# Patient Record
Sex: Female | Born: 1975 | Race: Black or African American | Hispanic: No | Marital: Single | Smoking: Current some day smoker
Health system: Southern US, Community
[De-identification: ages and names within clinical notes are randomized; demographics above are authoritative.]

## PROBLEM LIST (undated history)

## (undated) DIAGNOSIS — D649 Anemia, unspecified: Secondary | ICD-10-CM

## (undated) DIAGNOSIS — Z5189 Encounter for other specified aftercare: Secondary | ICD-10-CM

## (undated) HISTORY — PX: OTHER SURGICAL HISTORY: SHX169

## (undated) HISTORY — DX: Anemia, unspecified: D64.9

## (undated) HISTORY — DX: Encounter for other specified aftercare: Z51.89

---

## 2003-08-30 ENCOUNTER — Other Ambulatory Visit: Payer: Self-pay

## 2009-09-12 ENCOUNTER — Ambulatory Visit: Payer: Self-pay | Admitting: Family

## 2009-09-12 IMAGING — CR DG HIP COMPLETE 2+V*L*
1 series · 2 of 2 positions shown · non-contrast
Comparison: none

REASON FOR EXAM: leg pain hip pain back pain back pain
COMMENTS:

PROCEDURE:     DXR - DXR HIP LEFT COMPLETE  - [DATE]  [DATE]
RESULT:     Images of the left hip demonstrate no fracture, dislocation or
foreign body. There some mild degenerative hypertrophic spurring along the
superior margin of the acetabulum.

[Series 1: view not recorded · 0.17mm/px · 2 of 2 slices shown]
[im 1/2]
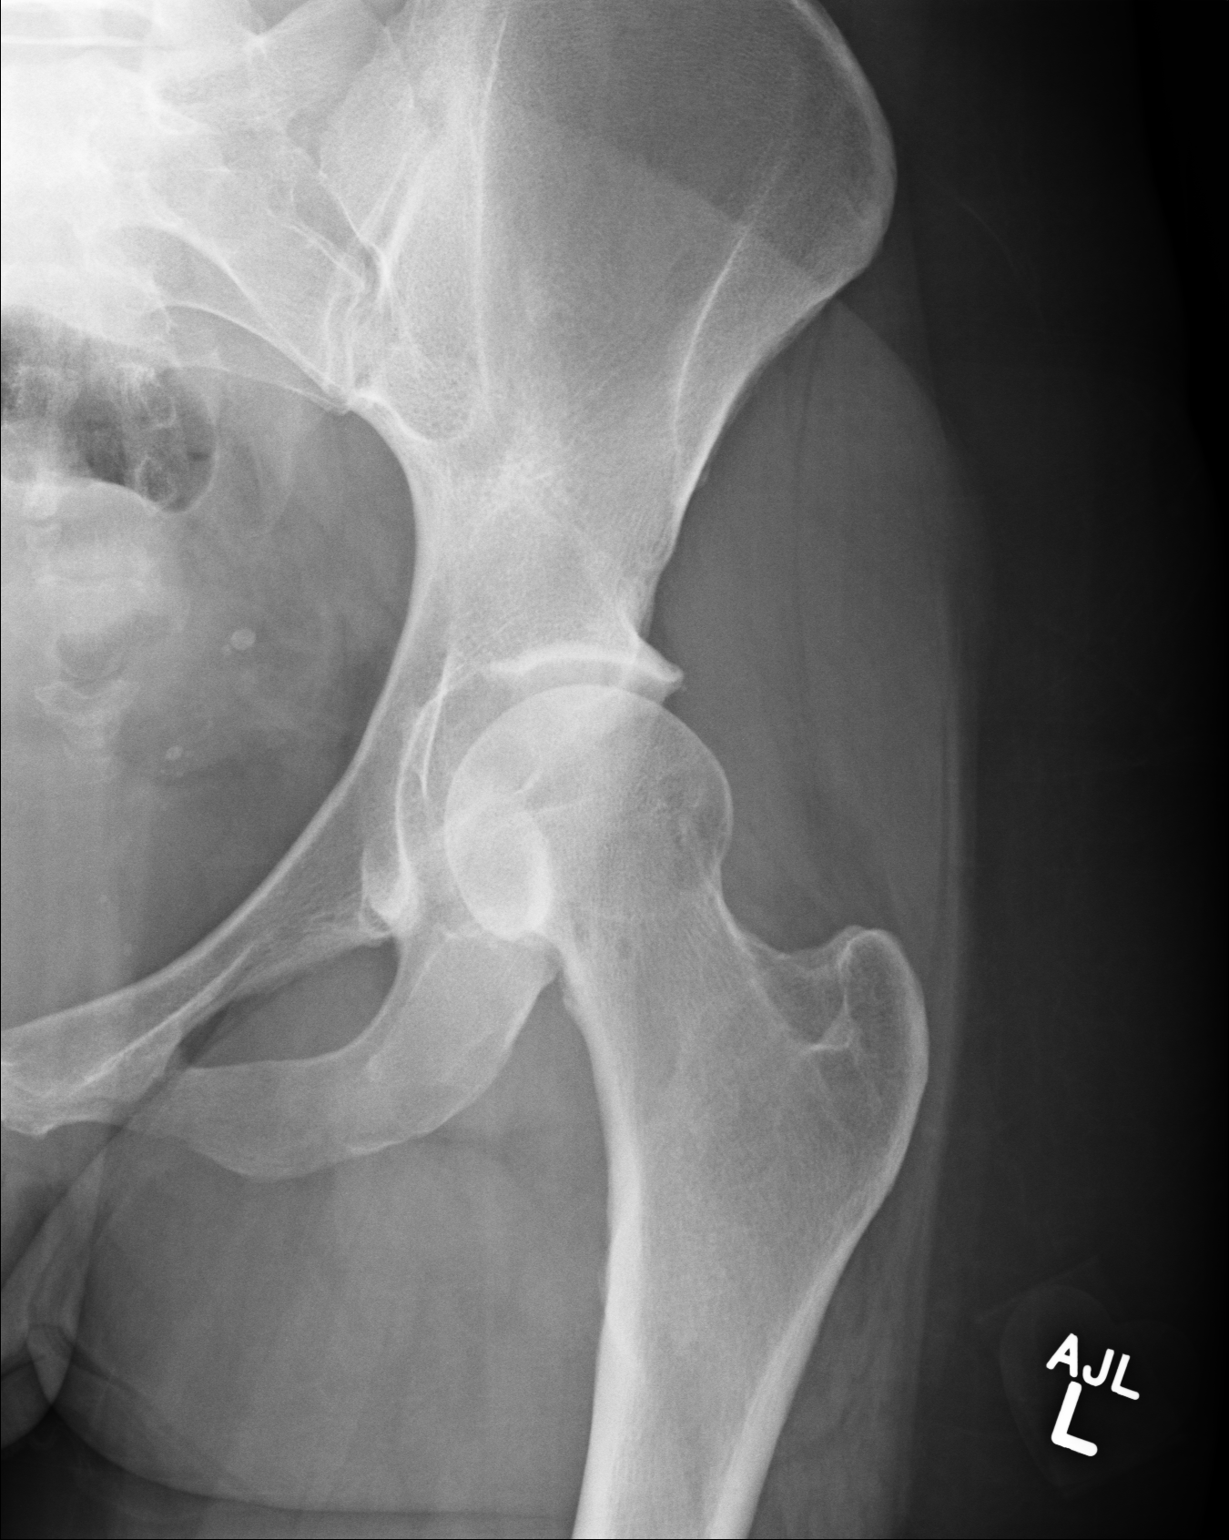
[im 2/2]
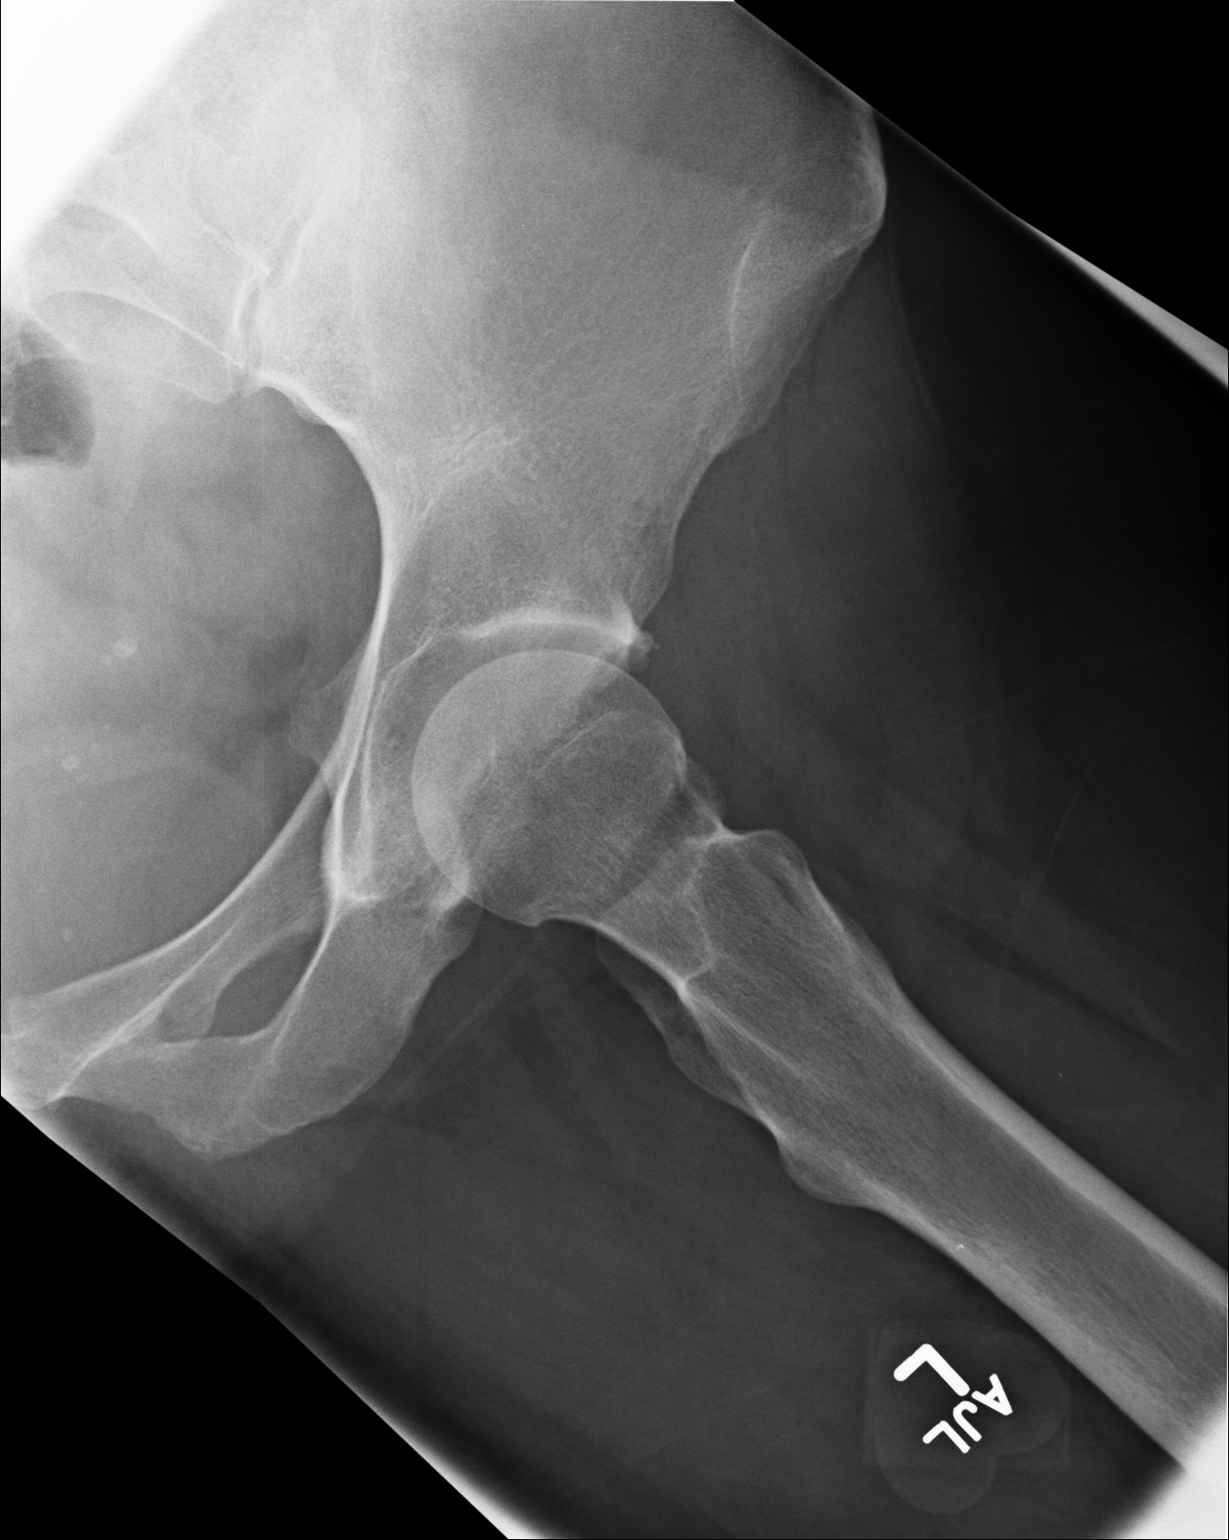

[2 of 2 positions shown; findings below may reference images not displayed]

IMPRESSION: No acute bony abnormality.

## 2009-09-12 IMAGING — CR DG LUMBAR SPINE AP/LAT/OBLIQUES W/ FLEX AND EXT
1 series · 5 of 5 positions shown · non-contrast
Comparison: none

REASON FOR EXAM: leg pain hip pain back pain back pain
COMMENTS:

PROCEDURE:     DXR - DXR LUMBAR SPINE WITH OBLIQUES  - [DATE]  [DATE]
RESULT:     Images of the lumbar spine show the facets are normally aligned.
The vertebral body heights and intervertebral disc spaces are maintained.
There is no compression deformity or fracture.

[Series 1: view not recorded · 0.17mm/px · 5 of 5 slices shown]
[im 1/5]
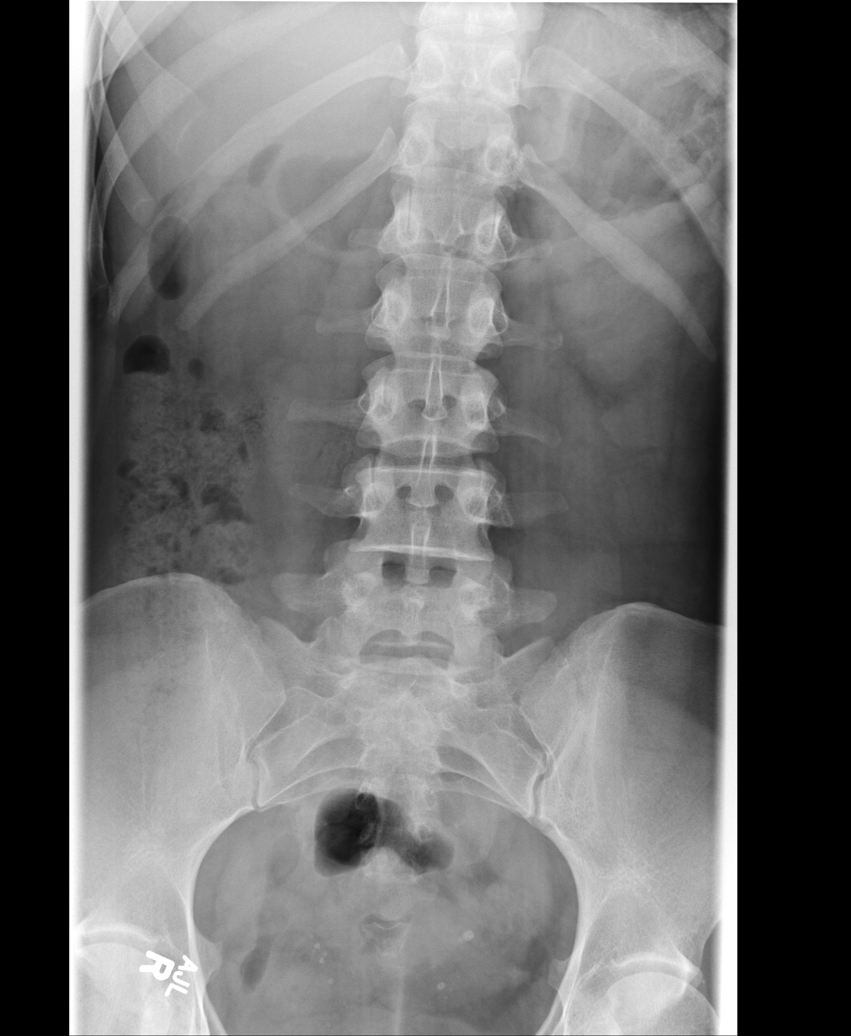
[im 2/5]
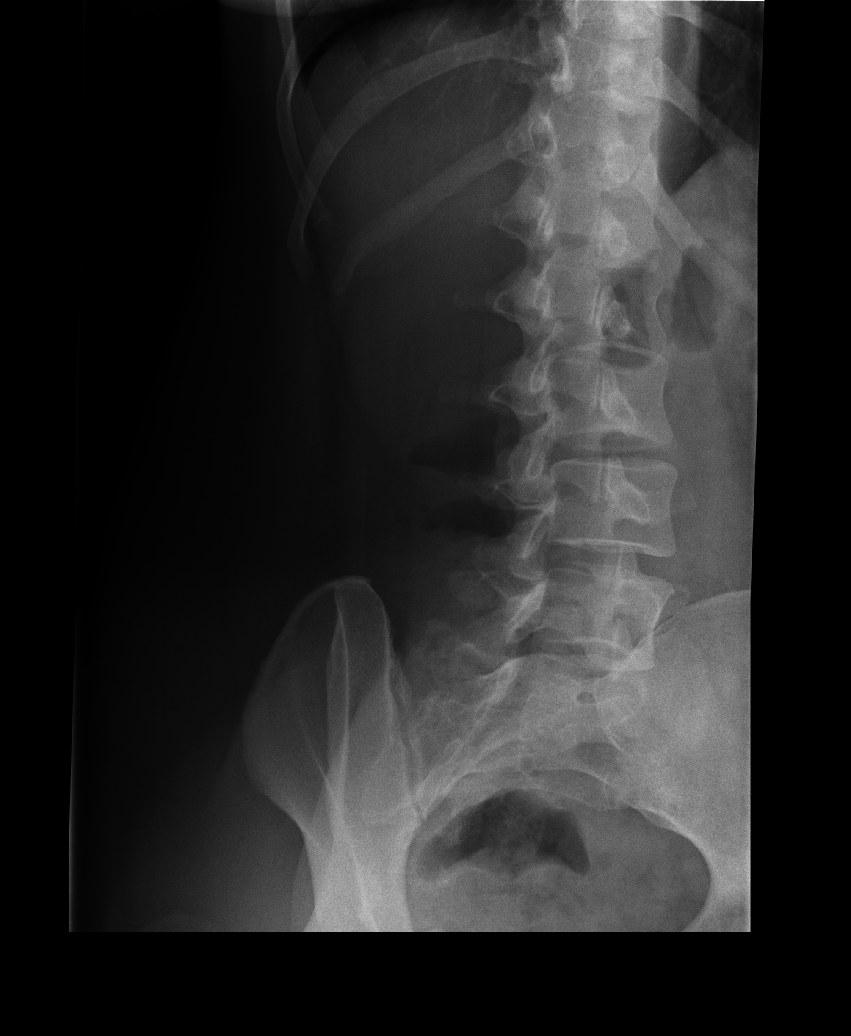
[im 3/5]
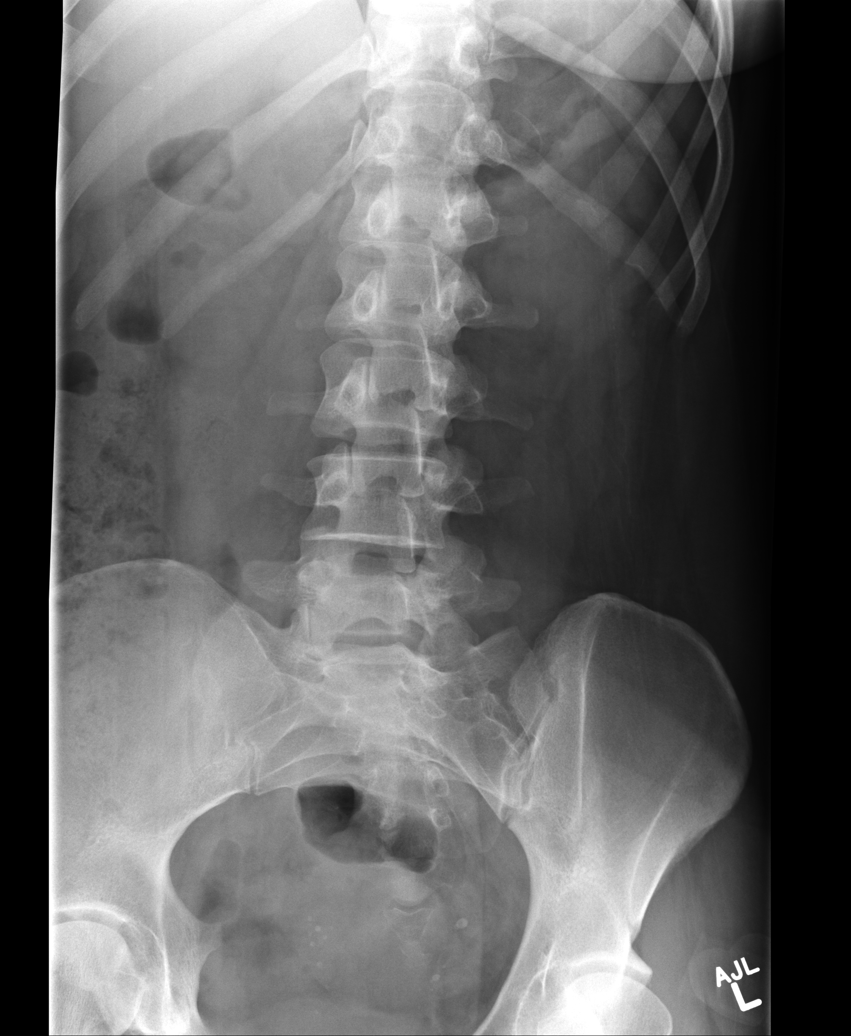
[im 4/5]
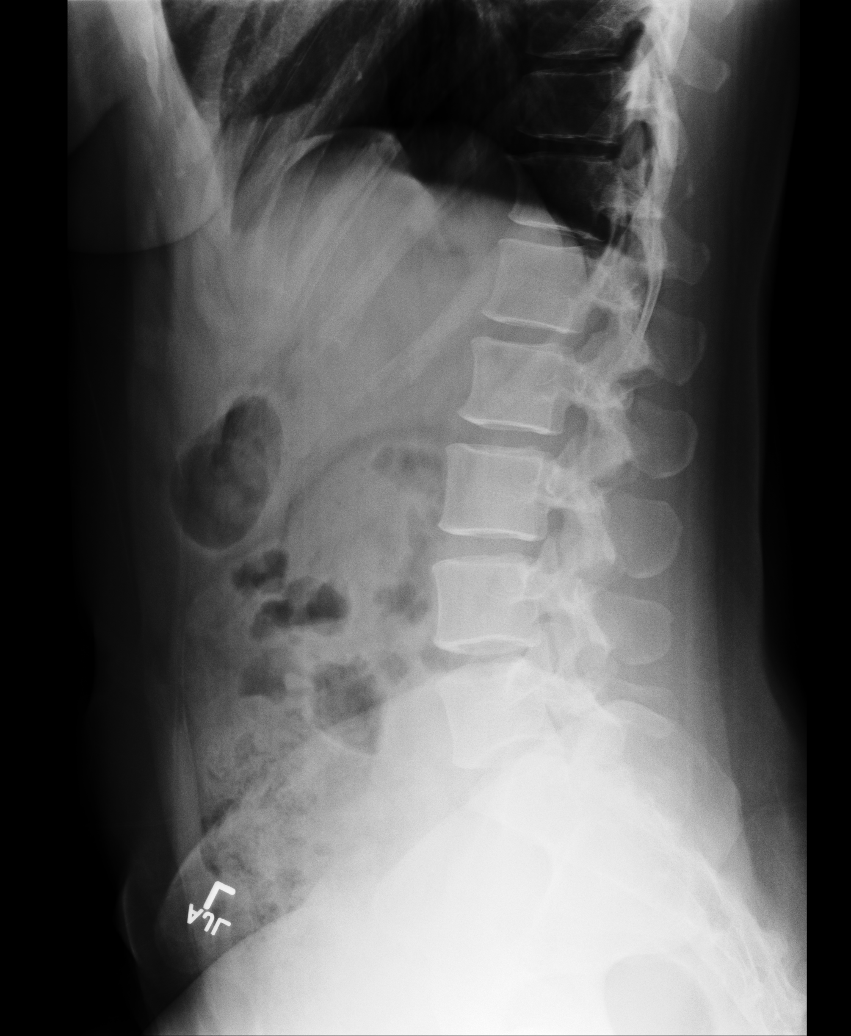
[im 5/5]
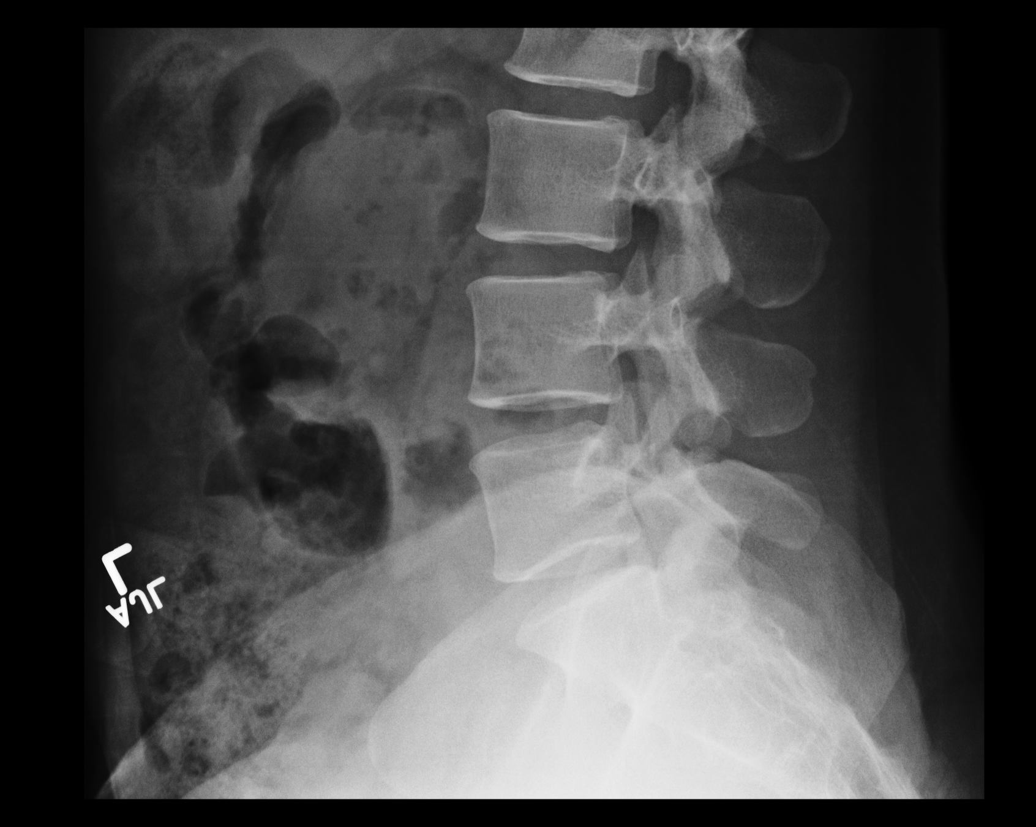

[5 of 5 positions shown; findings below may reference images not displayed]

IMPRESSION: No acute bony abnormality.

## 2011-11-28 ENCOUNTER — Emergency Department: Payer: Self-pay | Admitting: Emergency Medicine

## 2011-11-28 LAB — URINALYSIS, COMPLETE
Bacteria: NONE SEEN
Bilirubin,UR: NEGATIVE
Glucose,UR: NEGATIVE mg/dL (ref 0–75)
Ketone: NEGATIVE
Leukocyte Esterase: NEGATIVE
Nitrite: NEGATIVE
Ph: 7 (ref 4.5–8.0)
Protein: NEGATIVE
Specific Gravity: 1.01 (ref 1.003–1.030)
Squamous Epithelial: 3
WBC UR: NONE SEEN /HPF (ref 0–5)

## 2011-11-28 LAB — COMPREHENSIVE METABOLIC PANEL
Alkaline Phosphatase: 76 U/L (ref 50–136)
Anion Gap: 4 — ABNORMAL LOW (ref 7–16)
BUN: 12 mg/dL (ref 7–18)
Calcium, Total: 9.4 mg/dL (ref 8.5–10.1)
Co2: 29 mmol/L (ref 21–32)
Creatinine: 0.71 mg/dL (ref 0.60–1.30)
EGFR (African American): >60
EGFR (Non-African Amer.): >60
Osmolality: 274 (ref 275–301)
Potassium: 3.9 mmol/L (ref 3.5–5.1)
SGOT(AST): 23 U/L (ref 15–37)
Sodium: 138 mmol/L (ref 136–145)

## 2011-11-28 LAB — CK TOTAL AND CKMB (NOT AT ARMC): CK-MB: <0.5 ng/mL — ABNORMAL LOW (ref 0.5–3.6)

## 2011-11-28 LAB — TROPONIN I: Troponin-I: <0.02 ng/mL

## 2012-01-14 ENCOUNTER — Emergency Department: Payer: Self-pay | Admitting: Emergency Medicine

## 2013-11-12 ENCOUNTER — Emergency Department: Payer: Self-pay | Admitting: Emergency Medicine

## 2013-11-12 LAB — URINALYSIS, COMPLETE
Bacteria: NONE SEEN
Bilirubin,UR: NEGATIVE
Glucose,UR: NEGATIVE mg/dL (ref 0–75)
KETONE: NEGATIVE
LEUKOCYTE ESTERASE: NEGATIVE
NITRITE: NEGATIVE
PROTEIN: NEGATIVE
Ph: 6 (ref 4.5–8.0)
RBC,UR: 1 /HPF (ref 0–5)
SPECIFIC GRAVITY: 1.003 (ref 1.003–1.030)
WBC UR: 1 /HPF (ref 0–5)

## 2014-01-10 IMAGING — US US ABDOMEN LIMITED
1 series · 14 of 25 positions shown · non-contrast
Comparison: None.

CLINICAL DATA: Abdominal pain for 4 days.  Tenderness.

EXAM:
US ABDOMEN LIMITED - RIGHT UPPER QUADRANT

[Series 1: us abdomen limited · 0.22mm/px · 14 of 45 slices shown]
[im 1/45]
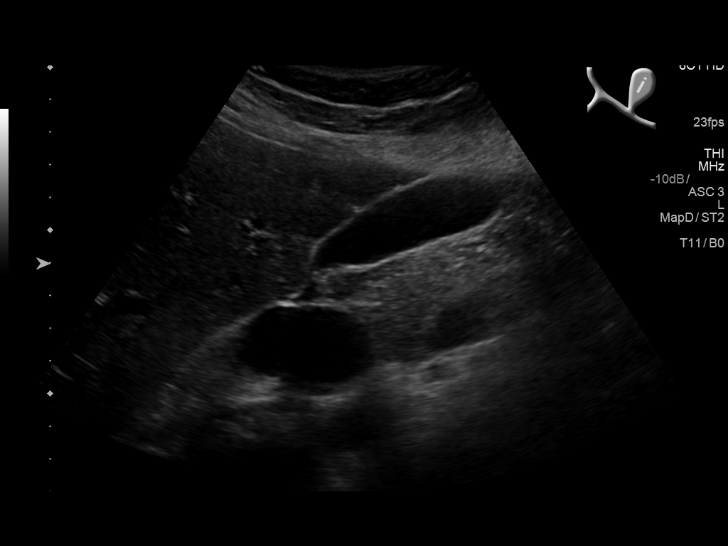
[im 4/45]
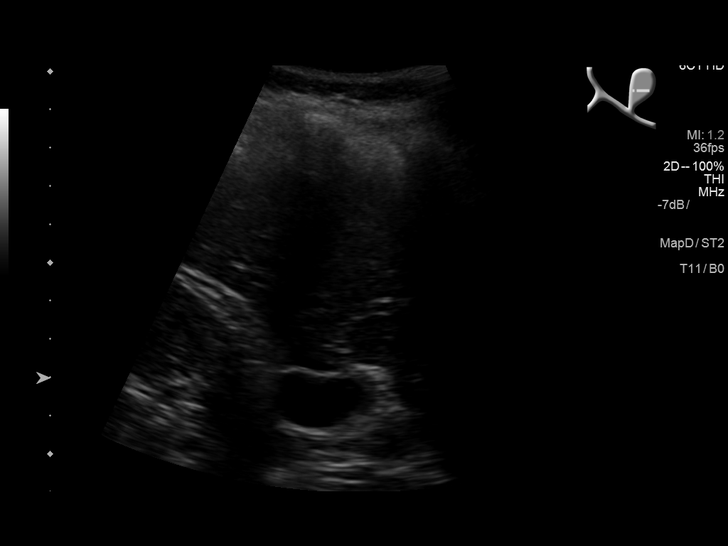
[im 8/45]
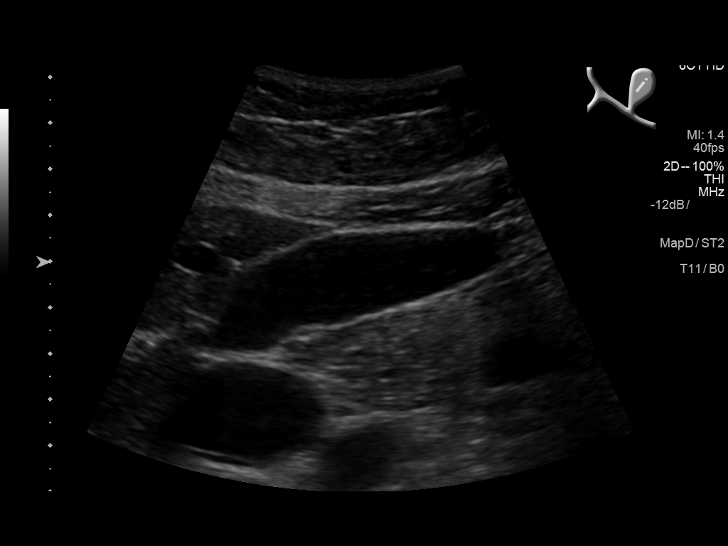
[im 12/45]
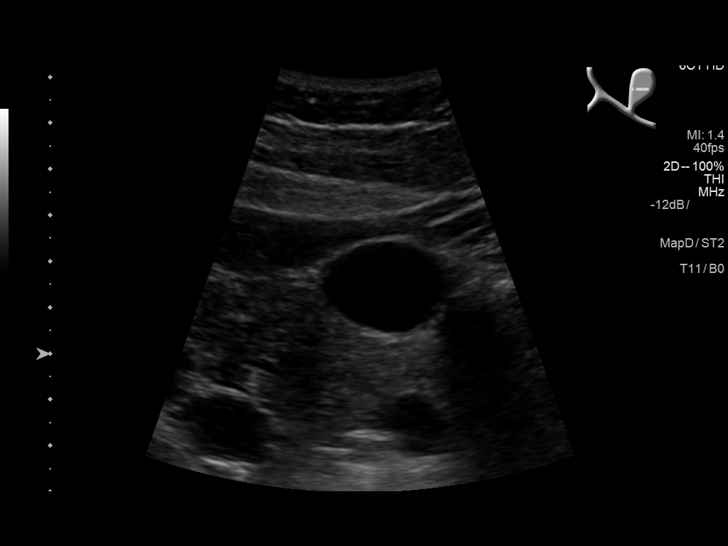
[im 15/45]
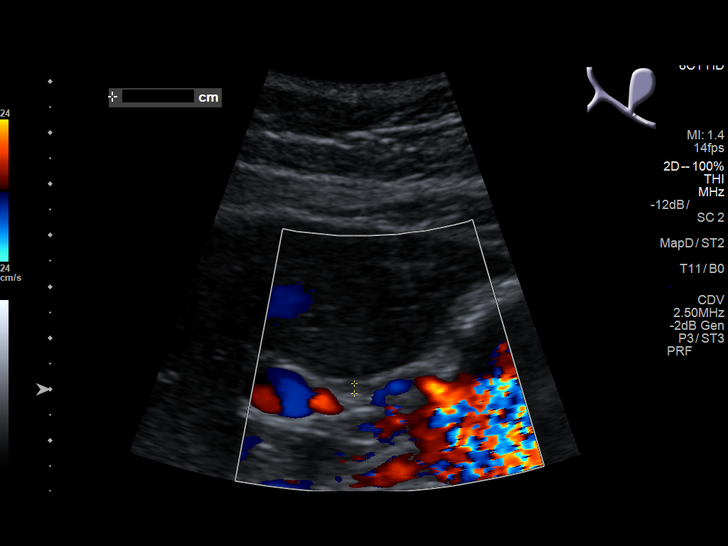
[im 17/45]
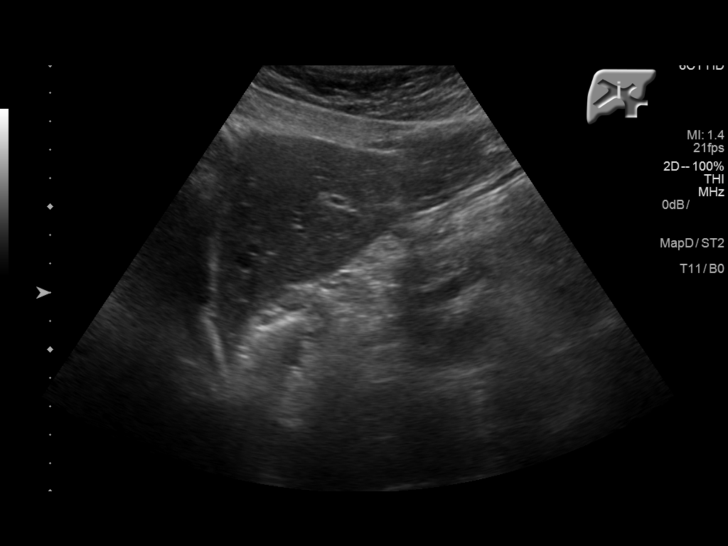
[im 21/45]
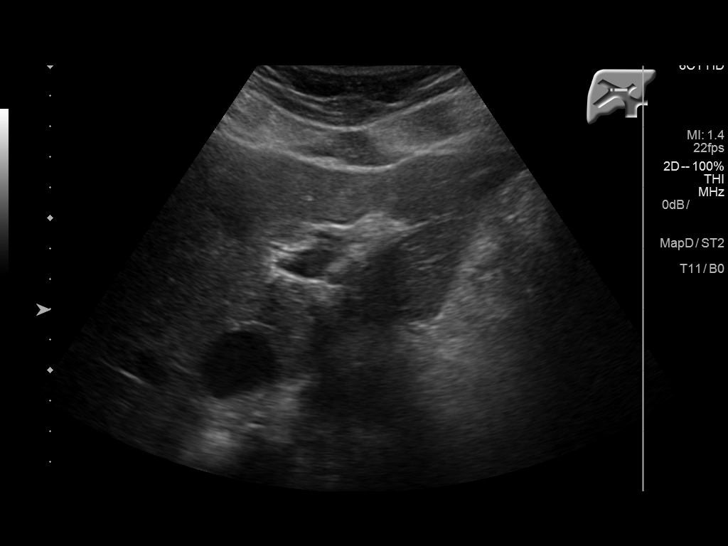
[im 24/45]
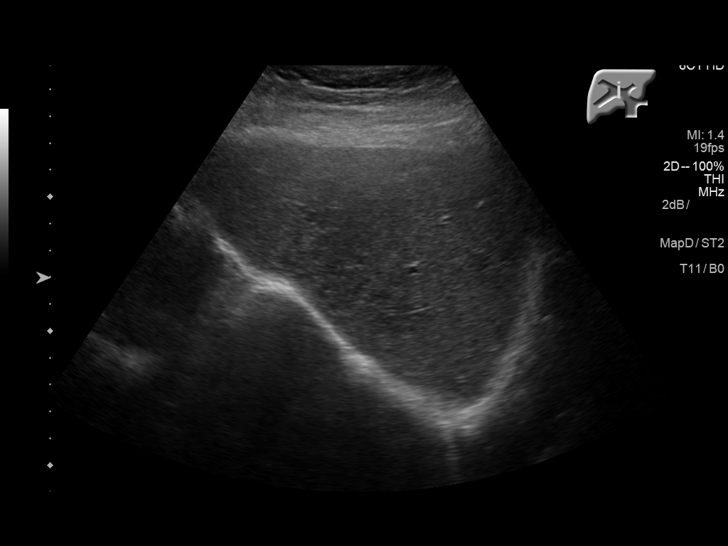
[im 28/45]
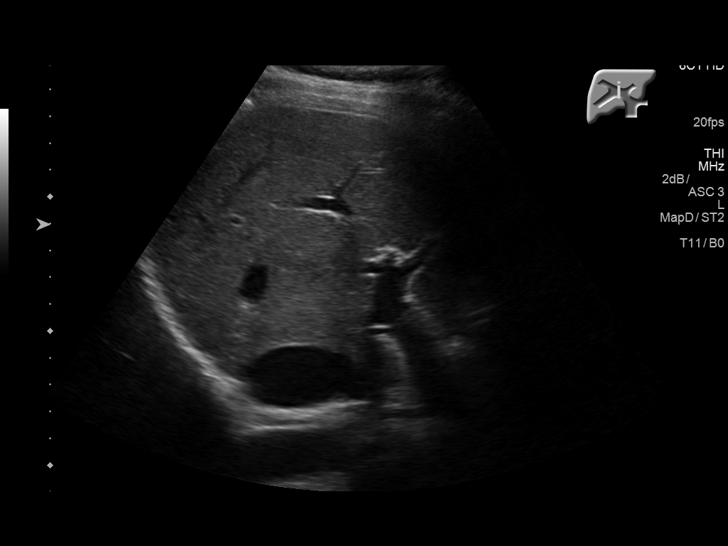
[im 30/45]
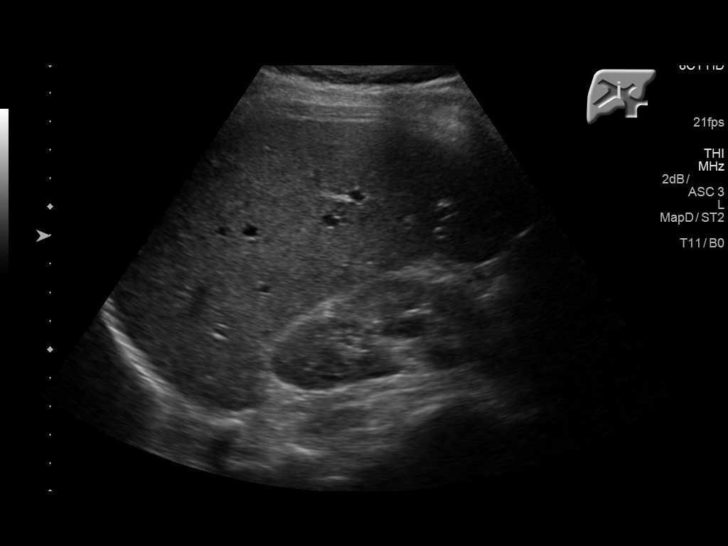
[im 34/45]
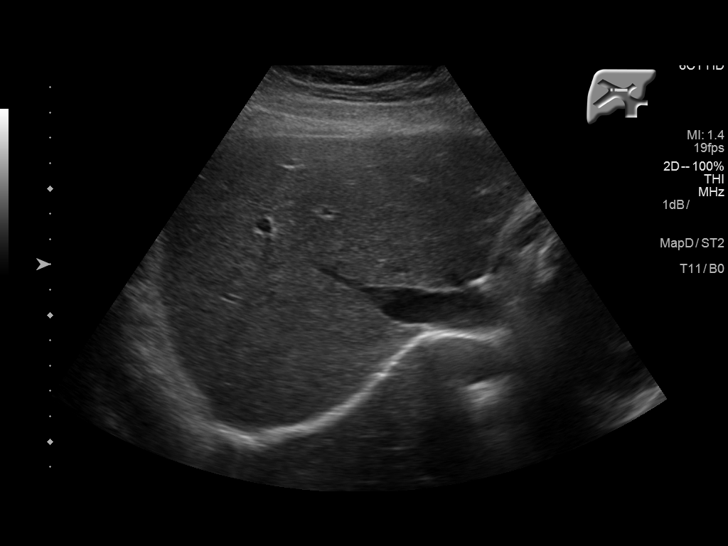
[im 37/45]
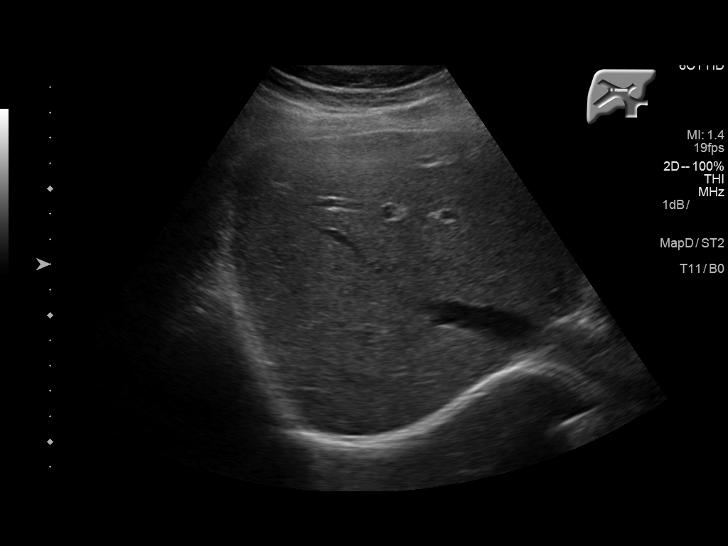
[im 41/45]
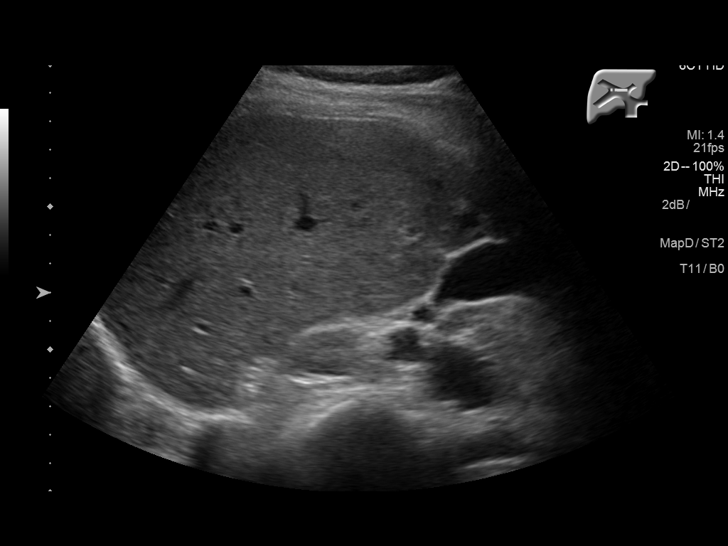
[im 45/45]
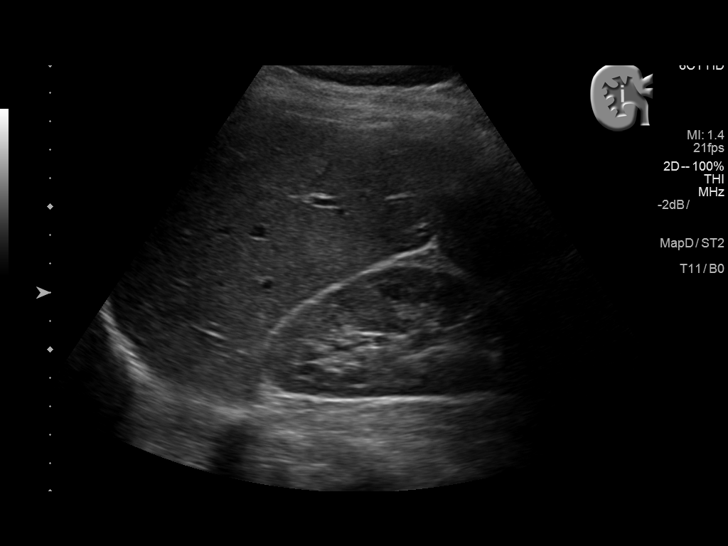

[14 of 25 positions shown; findings below may reference images not displayed]

FINDINGS: Gallbladder:

No gallstones or wall thickening visualized. No sonographic Murphy
sign noted by sonographer.

Common bile duct:

Diameter: 1.9 mm

Liver:

No focal lesion identified. Within normal limits in parenchymal
echogenicity.
IMPRESSION: Normal exam.

## 2014-11-04 ENCOUNTER — Emergency Department
Admission: EM | Admit: 2014-11-04 | Discharge: 2014-11-04 | Disposition: A | Discharge status: Home / Self Care | Payer: Self-pay | Attending: Emergency Medicine | Admitting: Emergency Medicine

## 2014-11-04 ENCOUNTER — Emergency Department: Payer: Self-pay

## 2014-11-04 ENCOUNTER — Encounter: Payer: Self-pay | Admitting: Emergency Medicine

## 2014-11-04 DIAGNOSIS — R0789 Other chest pain: Secondary | ICD-10-CM

## 2014-11-04 DIAGNOSIS — G43009 Migraine without aura, not intractable, without status migrainosus: Secondary | ICD-10-CM

## 2014-11-04 DIAGNOSIS — G43909 Migraine, unspecified, not intractable, without status migrainosus: Secondary | ICD-10-CM | POA: Insufficient documentation

## 2014-11-04 DIAGNOSIS — Z3202 Encounter for pregnancy test, result negative: Secondary | ICD-10-CM | POA: Insufficient documentation

## 2014-11-04 DIAGNOSIS — Z720 Tobacco use: Secondary | ICD-10-CM | POA: Insufficient documentation

## 2014-11-04 LAB — URINALYSIS COMPLETE WITH MICROSCOPIC (ARMC ONLY)
Bacteria, UA: NONE SEEN
Bilirubin Urine: NEGATIVE
GLUCOSE, UA: NEGATIVE mg/dL
Ketones, ur: NEGATIVE mg/dL
Leukocytes, UA: NEGATIVE
Nitrite: NEGATIVE
PROTEIN: NEGATIVE mg/dL
Specific Gravity, Urine: 1.004 — ABNORMAL LOW (ref 1.005–1.030)
pH: 7 (ref 5.0–8.0)

## 2014-11-04 LAB — COMPREHENSIVE METABOLIC PANEL
ALK PHOS: 52 U/L (ref 38–126)
ALT: 13 U/L — AB (ref 14–54)
AST: 21 U/L (ref 15–41)
Albumin: 4.3 g/dL (ref 3.5–5.0)
Anion gap: 6 (ref 5–15)
BILIRUBIN TOTAL: 0.5 mg/dL (ref 0.3–1.2)
BUN: 14 mg/dL (ref 6–20)
CALCIUM: 9.7 mg/dL (ref 8.9–10.3)
CO2: 28 mmol/L (ref 22–32)
CREATININE: 0.64 mg/dL (ref 0.44–1.00)
Chloride: 105 mmol/L (ref 101–111)
GFR calc non Af Amer: >60 mL/min (ref >60)
GLUCOSE: 87 mg/dL (ref 65–99)
Potassium: 4.1 mmol/L (ref 3.5–5.1)
SODIUM: 139 mmol/L (ref 135–145)
TOTAL PROTEIN: 7.1 g/dL (ref 6.5–8.1)

## 2014-11-04 LAB — CBC WITH DIFFERENTIAL/PLATELET
BASOS ABS: 0 10*3/uL (ref 0–0.1)
Basophils Relative: 1 %
Eosinophils Absolute: 0 10*3/uL (ref 0–0.7)
Eosinophils Relative: 1 %
HEMATOCRIT: 37 % (ref 35.0–47.0)
HEMOGLOBIN: 12.1 g/dL (ref 12.0–16.0)
LYMPHS PCT: 28 %
Lymphs Abs: 1.5 10*3/uL (ref 1.0–3.6)
MCH: 28.9 pg (ref 26.0–34.0)
MCHC: 32.8 g/dL (ref 32.0–36.0)
MCV: 88.3 fL (ref 80.0–100.0)
Monocytes Absolute: 0.5 10*3/uL (ref 0.2–0.9)
Monocytes Relative: 10 %
NEUTROS ABS: 3.2 10*3/uL (ref 1.4–6.5)
Neutrophils Relative %: 60 %
PLATELETS: 168 10*3/uL (ref 150–440)
RBC: 4.19 MIL/uL (ref 3.80–5.20)
RDW: 13.3 % (ref 11.5–14.5)
WBC: 5.2 10*3/uL (ref 3.6–11.0)

## 2014-11-04 LAB — LIPASE, BLOOD: Lipase: 28 U/L (ref 11–51)

## 2014-11-04 LAB — PREGNANCY, URINE: Preg Test, Ur: NEGATIVE

## 2014-11-04 LAB — TROPONIN I

## 2014-11-04 IMAGING — CR DG CHEST 2V
1 series · 2 of 2 positions shown · non-contrast
Comparison: None.

CLINICAL DATA: Anterior chest pain and head pressure for 3 days.

EXAM:
CHEST  2 VIEW

[Series 1: dg chest 2 view · 0.14mm/px · 2 of 2 slices shown]
[im 1/2]
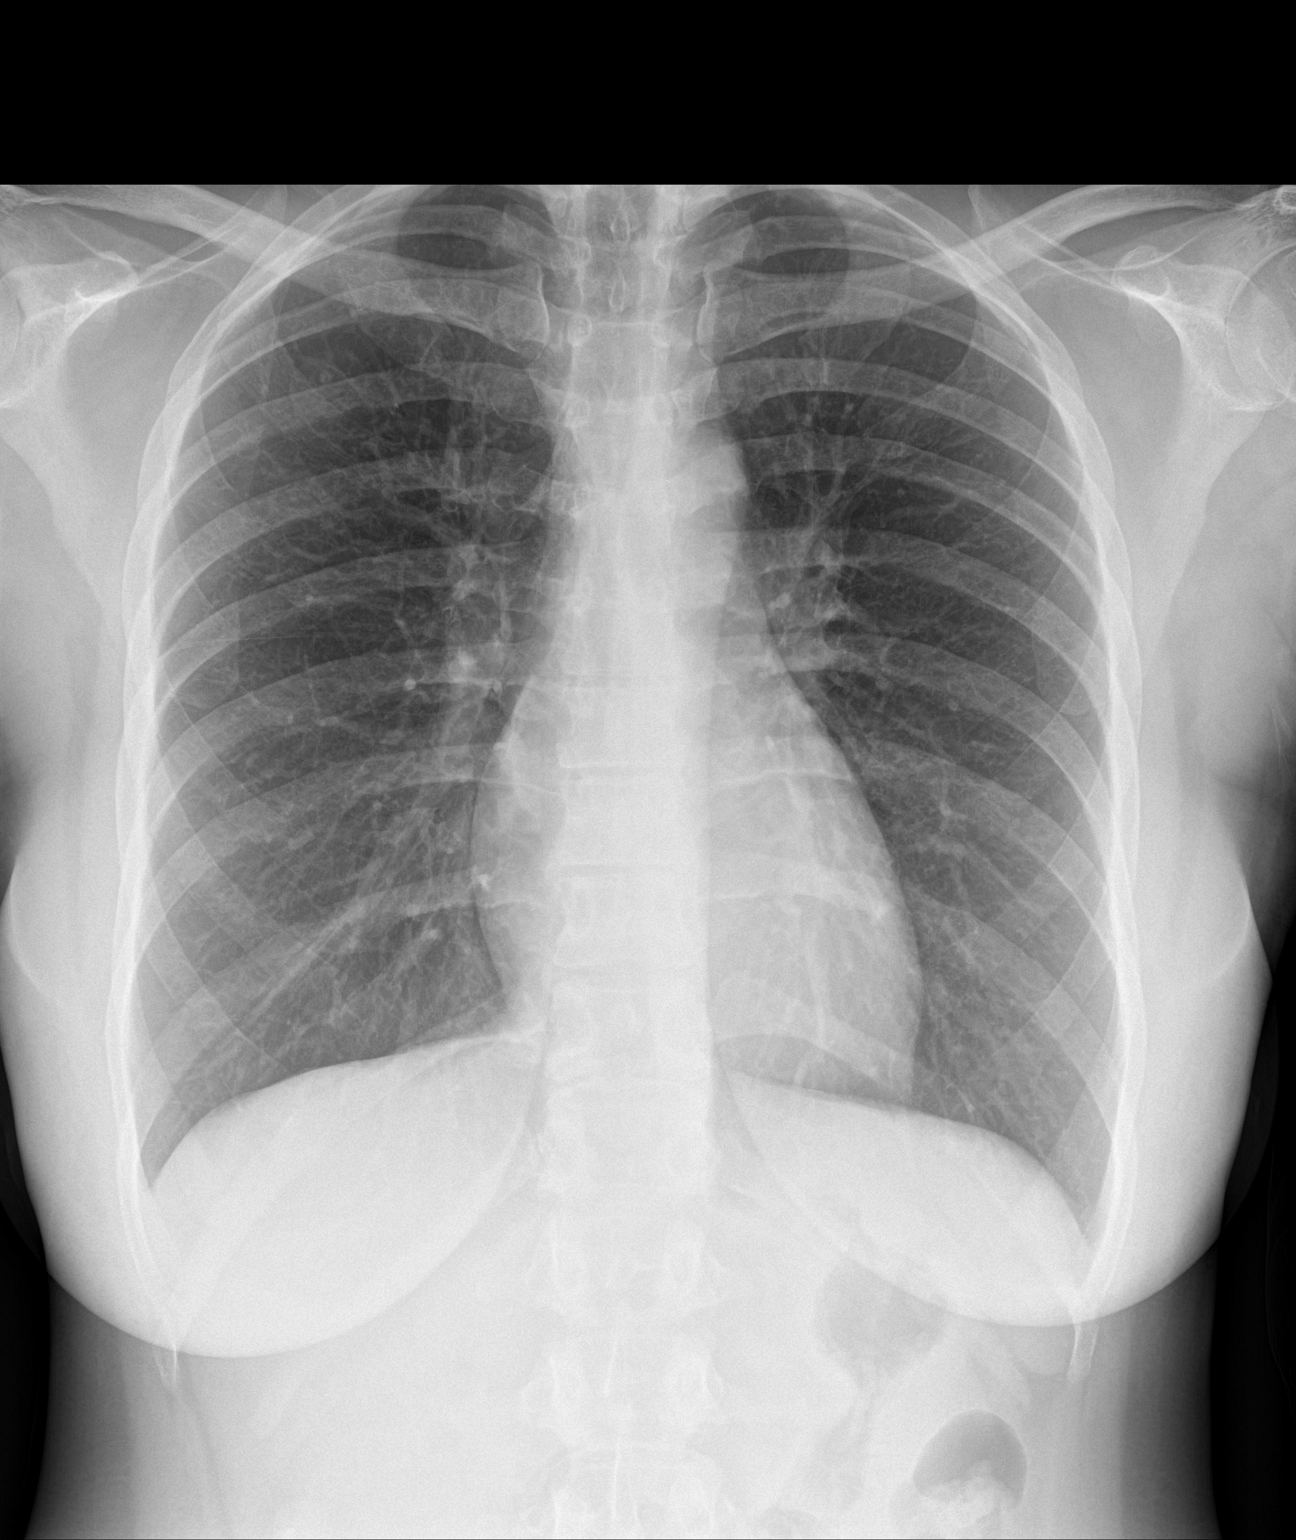
[im 2/2]
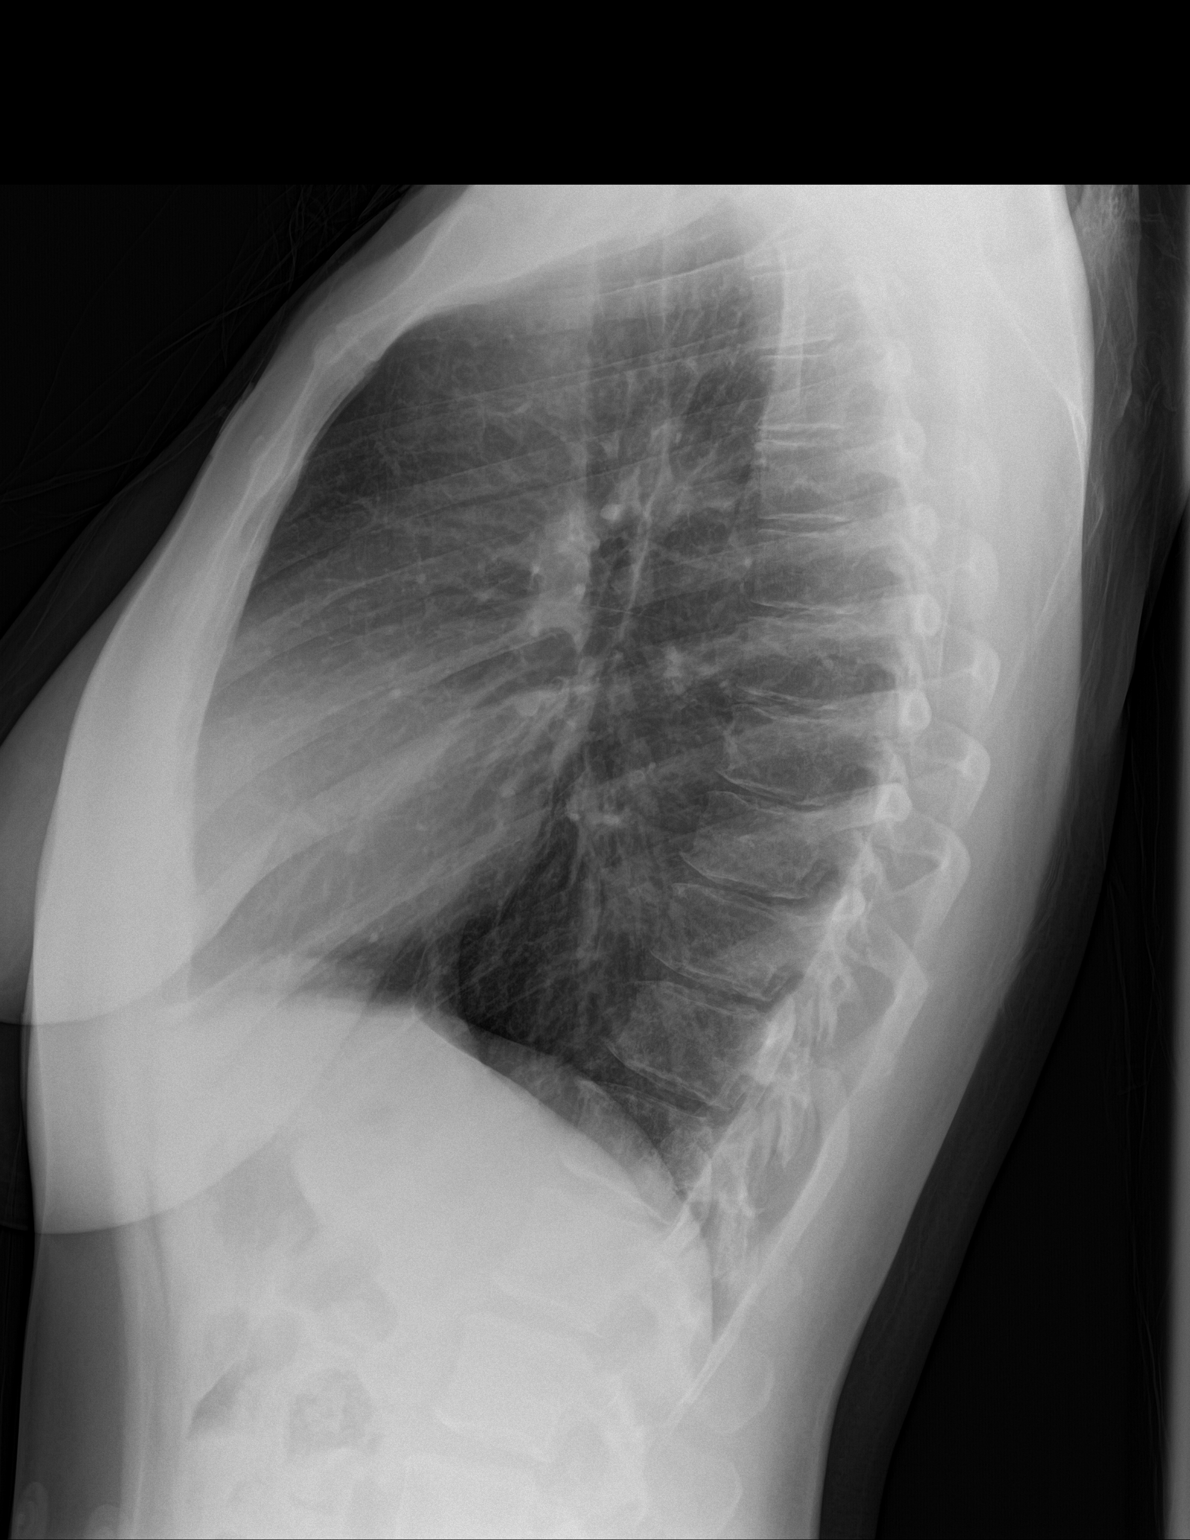

[2 of 2 positions shown; findings below may reference images not displayed]

FINDINGS: The heart size and mediastinal contours are within normal limits.
Both lungs are clear. The visualized skeletal structures are
unremarkable. Perhaps minimal degenerative change within the mid
thoracic spine.
IMPRESSION: No evidence of acute cardiopulmonary abnormality. Lungs are clear.
Heart size is normal.

## 2014-11-04 MED ORDER — DIPHENHYDRAMINE HCL 50 MG/ML IJ SOLN
12.5000 mg | Freq: Once | INTRAMUSCULAR | Status: AC
  Administered 2014-11-04: 12.5 mg via INTRAVENOUS
  Filled 2014-11-04: qty 1

## 2014-11-04 MED ORDER — SODIUM CHLORIDE 0.9 % IV BOLUS (SEPSIS)
500.0000 mL | INTRAVENOUS | Status: AC
  Administered 2014-11-04: 500 mL via INTRAVENOUS

## 2014-11-04 MED ORDER — METOCLOPRAMIDE HCL 5 MG/ML IJ SOLN
10.0000 mg | Freq: Once | INTRAMUSCULAR | Status: AC
Start: 2014-11-04 — End: 2014-11-04
  Administered 2014-11-04: 10 mg via INTRAVENOUS
  Filled 2014-11-04: qty 2

## 2014-11-04 MED ORDER — DEXAMETHASONE SODIUM PHOSPHATE 10 MG/ML IJ SOLN
10.0000 mg | Freq: Once | INTRAMUSCULAR | Status: AC
  Administered 2014-11-04: 10 mg via INTRAVENOUS
  Filled 2014-11-04: qty 1

## 2014-11-04 MED ORDER — KETOROLAC TROMETHAMINE 30 MG/ML IJ SOLN
30.0000 mg | Freq: Once | INTRAMUSCULAR | Status: AC
  Administered 2014-11-04: 30 mg via INTRAVENOUS
  Filled 2014-11-04: qty 1

## 2014-11-04 NOTE — Discharge Instructions (Signed)
You have been seen in the Emergency Department (ED) for a migraine.  Please use Tylenol or Motrin as needed for symptoms, but only as written on the box, and take any regular medications that have been prescribed for you. As we have discussed, please follow up with your doctor as soon as possible regarding todays ED visit and your headache symptoms.    Call your doctor or return to the Emergency Department (ED) if you have a worsening headache, sudden and severe headache, confusion, slurred speech, facial droop, weakness or numbness in any arm or leg, extreme fatigue, or other symptoms that concern you.   Migraine Headache A migraine headache is very bad, throbbing pain on one or both sides of your head. Talk to your doctor about what things may bring on (trigger) your migraine headaches. HOME CARE  Only take medicines as told by your doctor.  Lie down in a dark, quiet room when you have a migraine.  Keep a journal to find out if certain things bring on migraine headaches. For example, write down:  What you eat and drink.  How much sleep you get.  Any change to your diet or medicines.  Lessen how much alcohol you drink.  Quit smoking if you smoke.  Get enough sleep.  Lessen any stress in your life.  Keep lights dim if bright lights bother you or make your migraines worse. GET HELP RIGHT AWAY IF:   Your migraine becomes really bad.  You have a fever.  You have a stiff neck.  You have trouble seeing.  Your muscles are weak, or you lose muscle control.  You lose your balance or have trouble walking.  You feel like you will pass out (faint), or you pass out.  You have really bad symptoms that are different than your first symptoms. MAKE SURE YOU:   Understand these instructions.  Will watch your condition.  Will get help right away if you are not doing well or get worse.   This information is not intended to replace advice given to you by your health care  provider. Make sure you discuss any questions you have with your health care provider.   Document Released: 10/01/2007 Document Revised: 03/16/2011 Document Reviewed: 08/29/2012 Elsevier Interactive Patient Education [employer]2016 [employer]

## 2014-11-04 NOTE — ED Provider Notes (Signed)
[facility] Emergency Department Provider Note  ____________________________________________  Time seen: Approximately 1:14 PM  I have reviewed the triage vital signs and the nursing notes.   HISTORY  Chief Complaint Chest Pain    HPI Cynthia Warren is a 39 y.o. female with no significant past medical history other than tobacco abuse who presents with approximately 3 days of gradual onset worsening left sided head pain/pressure that radiates down her face and ear and now having some left-sided sharp chest pain as well.  She denies any visual symptoms.  She does not describe the headache as pain some much as pressure and a feeling of fullness.  Nothing makes it better and nothing makes it worse.  She denies fever/chills, shortness of breath, abdominal pain, nausea/vomiting, dysuria, abdominal pain.  She describes the chest pain as sharp, mild, and intermittent but given the other symptoms she was having it was concerning her that maybe she was having a heart attack.   History reviewed. No pertinent past medical history.  There are no active problems to display for this patient.   Past Surgical History  Procedure Laterality Date  . C section times 2      No current outpatient prescriptions on file.  Allergies Review of patient's allergies indicates no known allergies.  No family history on file.  Social History Social History  Substance Use Topics  . Smoking status: Current Some Day Smoker    Types: Cigarettes  . Smokeless tobacco: Never Used  . Alcohol Use: Yes     Comment: ocassionally    Review of Systems Constitutional: No fever/chills Eyes: No visual changes. ENT: No sore throat.  Pressure in her head, primarily on the left side, radiating into the left side of her face and down her neck Cardiovascular: Occasional intermittent sharp chest pains on the left side Respiratory: Denies shortness of breath. Gastrointestinal: No abdominal  pain.  No nausea, no vomiting.  No diarrhea.  No constipation. Genitourinary: Negative for dysuria. Musculoskeletal: Negative for back pain. Skin: Negative for rash. Neurological: Negative for headaches, focal weakness or numbness.  10-point ROS otherwise negative.  ____________________________________________   PHYSICAL EXAM:  VITAL SIGNS: ED Triage Vitals  Enc Vitals Group     BP 11/04/14 1133 142/88 mmHg     Pulse Rate 11/04/14 1133 90     Resp 11/04/14 1133 18     Temp 11/04/14 1133 98.6 F (37 C)     Temp Source 11/04/14 1133 Oral     SpO2 11/04/14 1133 100 %     Weight 11/04/14 1133 160 lb (72.576 kg)     Height 11/04/14 1133  (1.753 m)     Head Cir --      Peak Flow --      Pain Score 11/04/14 1140 7     Pain Loc --      Pain Edu? --      Excl. in GC? --     Constitutional: Alert and oriented. Well appearing and in no acute distress. Eyes: Conjunctivae are normal. PERRL. EOMI. Head: Atraumatic.  Mild tenderness to palpation of the left maxillary sinus.  Bilateral ear canals are nonerythematous and the TMs are normal in appearance with no sign of effusion Nose: No congestion/rhinnorhea. Mouth/Throat: Mucous membranes are moist.  Oropharynx non-erythematous. Neck: No stridor.  No meningismus.  No cervical spine tenderness Cardiovascular: Normal rate, regular rhythm. Grossly normal heart sounds.  Good peripheral circulation. Respiratory: Normal respiratory effort.  No retractions. Lungs  CTAB. Gastrointestinal: Soft and nontender. No distention. No abdominal bruits. No CVA tenderness. Musculoskeletal: No lower extremity tenderness nor edema.  No joint effusions. Neurologic:  Normal speech and language. No gross focal neurologic deficits are appreciated.  Skin:  Skin is warm, dry and intact. No rash noted. Psychiatric: Mood and affect are normal. Speech and behavior are normal.  ____________________________________________   LABS (all labs ordered are listed,  but only abnormal results are displayed)  Labs Reviewed  COMPREHENSIVE METABOLIC PANEL - Abnormal; Notable for the following:    ALT 13 (*)    All other components within normal limits  URINALYSIS COMPLETEWITH MICROSCOPIC ([facility] ONLY) - Abnormal; Notable for the following:    Color, Urine STRAW (*)    APPearance CLEAR (*)    Specific Gravity, Urine 1.004 (*)    Hgb urine dipstick 1+ (*)    Squamous Epithelial / LPF 6-30 (*)    All other components within normal limits  LIPASE, BLOOD  TROPONIN I  CBC WITH DIFFERENTIAL/PLATELET  PREGNANCY, URINE   ____________________________________________  EKG  ED ECG REPORT I, Melondy Blanchard, the attending physician, personally viewed and interpreted this ECG.  Date: 11/04/2014 EKG Time: 11:46 Rate: 71 Rhythm: normal sinus rhythm QRS Axis: normal Intervals: Incomplete right bundle branch block ST/T Wave abnormalities: normal Conduction Disutrbances: none Narrative Interpretation: unremarkable  ____________________________________________  RADIOLOGY   Dg Chest 2 View  11/04/2014  CLINICAL DATA:  Anterior chest pain and head pressure for 3 days. EXAM: CHEST  2 VIEW COMPARISON:  None. FINDINGS: The heart size and mediastinal contours are within normal limits. Both lungs are clear. The visualized skeletal structures are unremarkable. Perhaps minimal degenerative change within the mid thoracic spine. IMPRESSION: No evidence of acute cardiopulmonary abnormality. Lungs are clear. Heart size is normal. Electronically Signed   By: Bary Richard M.D.   On: 11/04/2014 12:25    ____________________________________________   PROCEDURES  Procedure(s) performed: None  Critical Care performed: No ____________________________________________   INITIAL IMPRESSION / ASSESSMENT AND PLAN / ED COURSE  Pertinent labs & imaging results that were available during my care of the patient were reviewed by me and considered in my medical decision making  (see chart for details).  The patient's symptoms are somewhat unusual but she is very well-appearing with normal vital signs and is in no acute distress.  Her lab workup is unremarkable and her chest x-ray is unremarkable.  I suspect she may be having either an atypical migraine or possibly a mild sinus infection.  Otherwise I do not believe she would benefit from imaging at this time. Differential diagnosis includes but is not exclusive to subarachnoid hemorrhage, meningitis, encephalitis, previous head trauma, cavernous venous thrombosis, muscle tension headache, migraine or migraine equivalent, etc, but I do not believe that any of the emergent/severe or life-threatening causes are present at this time.  I discussed this with her and we agreed that we would treat empirically as a migraine and reassess for changes in her symptoms.  ----------------------------------------- 3:42 PM on 11/04/2014 -----------------------------------------  The patient feels much better and her pain has resolved.  She states she is ready to go home and she appears very happy with her treatment.  I gave her my usual and customary return precautions and will discharge with the diagnosis of atypical migraine. ____________________________________________  FINAL CLINICAL IMPRESSION(S) / ED DIAGNOSES  Final diagnoses:  Atypical migraine  Atypical chest pain      NEW MEDICATIONS STARTED DURING THIS VISIT:  New Prescriptions  No medications on file     Loleta Roseory Wyonia Fontanella, MD 11/04/14 [id number]

## 2014-11-04 NOTE — ED Notes (Addendum)
Pt reports chest pain and left side pressure in her head, face and ear that started 3 days ago. Pt reports pain has become worse over the past 3 days  Denies cold symptoms or fever.

## 2015-04-22 ENCOUNTER — Encounter: Payer: Self-pay | Admitting: Emergency Medicine

## 2015-04-22 ENCOUNTER — Emergency Department: Payer: Self-pay

## 2015-04-22 ENCOUNTER — Emergency Department
Admission: EM | Admit: 2015-04-22 | Discharge: 2015-04-22 | Disposition: A | Discharge status: Home / Self Care | Payer: Self-pay | Attending: Emergency Medicine | Admitting: Emergency Medicine

## 2015-04-22 DIAGNOSIS — F17210 Nicotine dependence, cigarettes, uncomplicated: Secondary | ICD-10-CM | POA: Insufficient documentation

## 2015-04-22 DIAGNOSIS — R109 Unspecified abdominal pain: Secondary | ICD-10-CM | POA: Insufficient documentation

## 2015-04-22 DIAGNOSIS — M549 Dorsalgia, unspecified: Secondary | ICD-10-CM | POA: Insufficient documentation

## 2015-04-22 LAB — WET PREP, GENITAL
CLUE CELLS WET PREP: NONE SEEN
SPERM: NONE SEEN
Trich, Wet Prep: NONE SEEN
Yeast Wet Prep HPF POC: NONE SEEN

## 2015-04-22 LAB — CBC
HEMATOCRIT: 37.1 % (ref 35.0–47.0)
HEMOGLOBIN: 12.1 g/dL (ref 12.0–16.0)
MCH: 28.7 pg (ref 26.0–34.0)
MCHC: 32.6 g/dL (ref 32.0–36.0)
MCV: 87.9 fL (ref 80.0–100.0)
Platelets: 186 10*3/uL (ref 150–440)
RBC: 4.22 MIL/uL (ref 3.80–5.20)
RDW: 13.1 % (ref 11.5–14.5)
WBC: 8.8 10*3/uL (ref 3.6–11.0)

## 2015-04-22 LAB — COMPREHENSIVE METABOLIC PANEL
ALBUMIN: 4.4 g/dL (ref 3.5–5.0)
ALK PHOS: 74 U/L (ref 38–126)
ALT: 14 U/L (ref 14–54)
AST: 23 U/L (ref 15–41)
Anion gap: 8 (ref 5–15)
BUN: 15 mg/dL (ref 6–20)
CHLORIDE: 105 mmol/L (ref 101–111)
CO2: 24 mmol/L (ref 22–32)
Calcium: 9.4 mg/dL (ref 8.9–10.3)
Creatinine, Ser: 0.72 mg/dL (ref 0.44–1.00)
GFR calc Af Amer: >60 mL/min (ref >60)
GFR calc non Af Amer: >60 mL/min (ref >60)
Glucose, Bld: 87 mg/dL (ref 65–99)
Potassium: 3.5 mmol/L (ref 3.5–5.1)
SODIUM: 137 mmol/L (ref 135–145)
Total Bilirubin: 0.5 mg/dL (ref 0.3–1.2)
Total Protein: 7.9 g/dL (ref 6.5–8.1)

## 2015-04-22 LAB — URINALYSIS COMPLETE WITH MICROSCOPIC (ARMC ONLY)
Bilirubin Urine: NEGATIVE
Glucose, UA: NEGATIVE mg/dL
Ketones, ur: NEGATIVE mg/dL
LEUKOCYTES UA: NEGATIVE
NITRITE: NEGATIVE
PH: 5 (ref 5.0–8.0)
Protein, ur: NEGATIVE mg/dL
SPECIFIC GRAVITY, URINE: 1.012 (ref 1.005–1.030)

## 2015-04-22 LAB — CHLAMYDIA/NGC RT PCR (ARMC ONLY)
Chlamydia Tr: NOT DETECTED
N gonorrhoeae: NOT DETECTED

## 2015-04-22 LAB — LIPASE, BLOOD: LIPASE: 22 U/L (ref 11–51)

## 2015-04-22 LAB — POCT PREGNANCY, URINE: PREG TEST UR: NEGATIVE

## 2015-04-22 IMAGING — CR DG ABDOMEN ACUTE W/ 1V CHEST
1 series · 4 of 4 positions shown · non-contrast
Comparison: Multiple exams, including [DATE]

CLINICAL DATA: Abdominal pain and back pain.

EXAM:
DG ABDOMEN ACUTE W/ 1V CHEST

[Series 1: dg abd acute w/chest · 0.14mm/px · 4 of 4 slices shown]
[im 1/4]
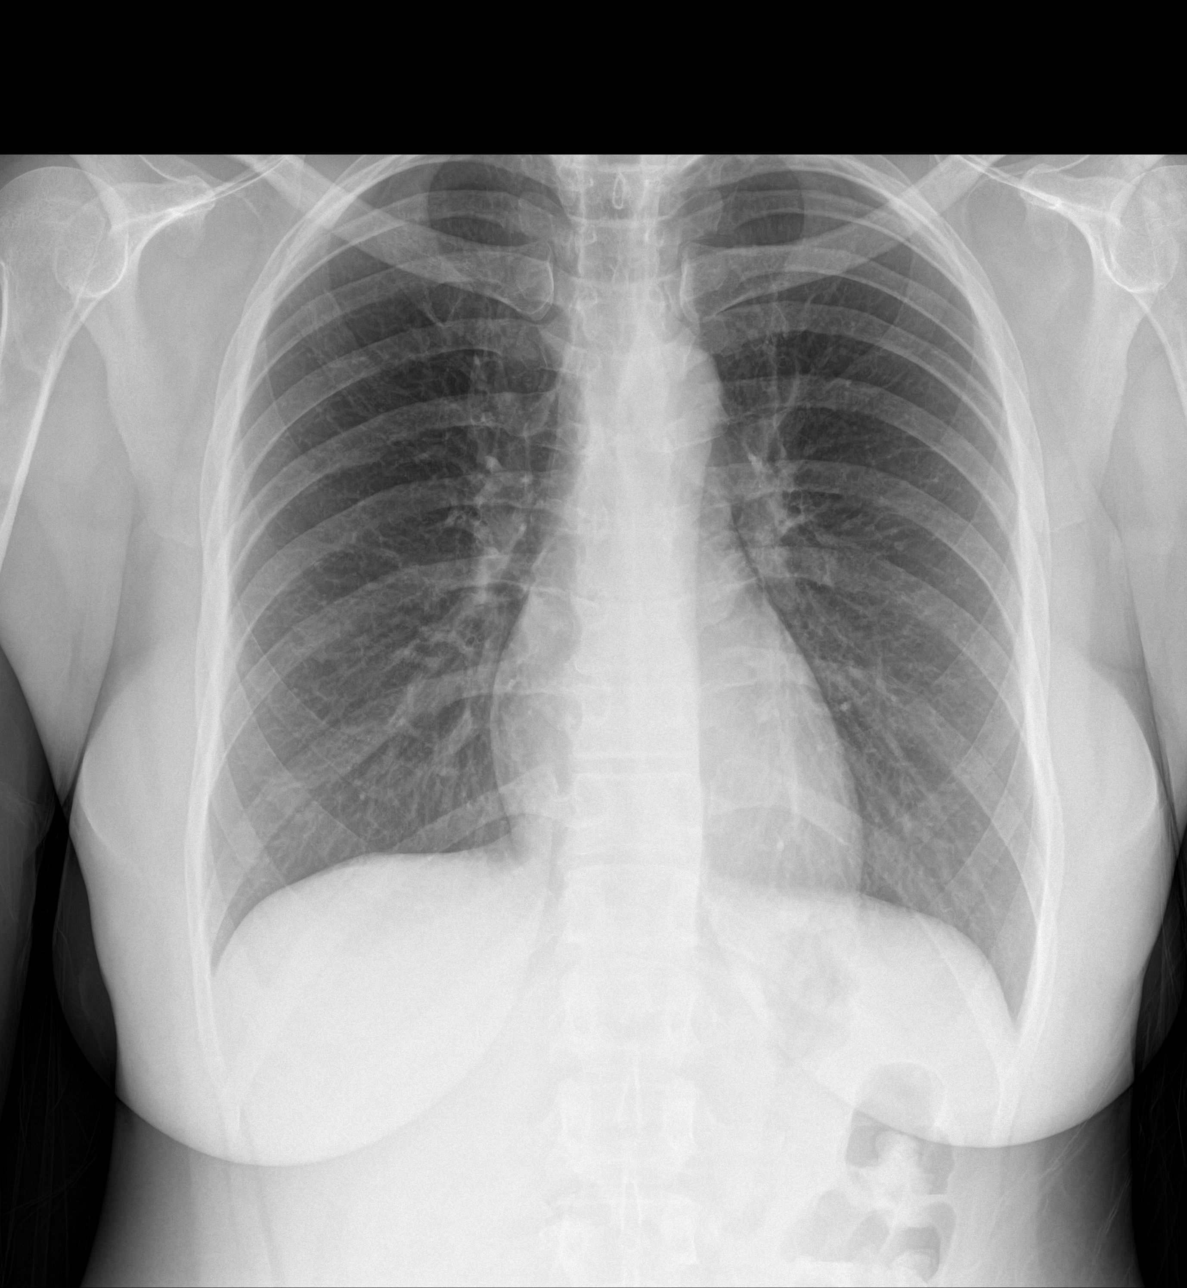
[im 2/4]
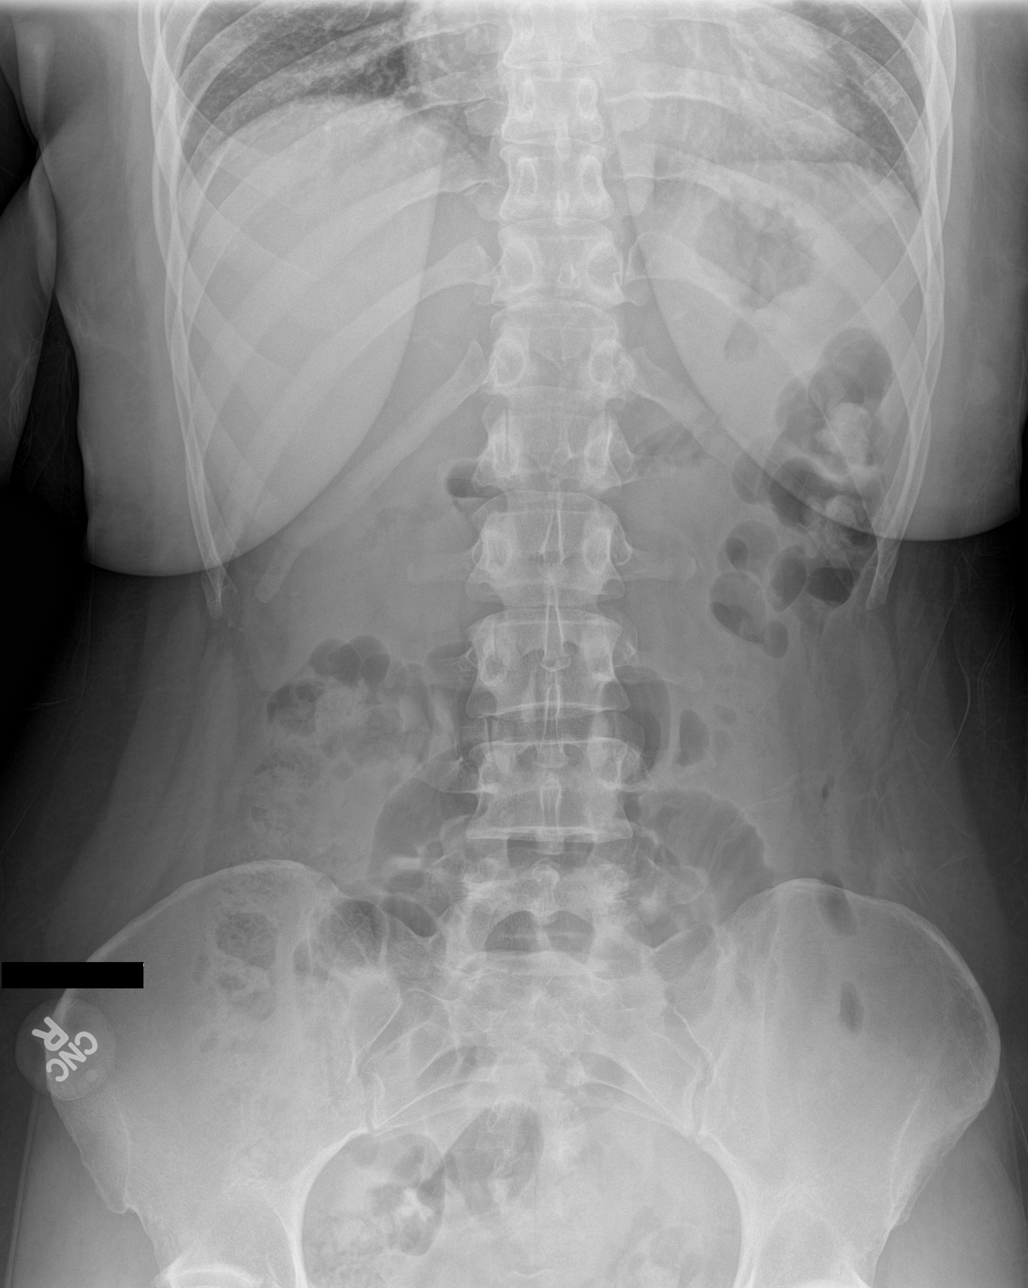
[im 3/4]
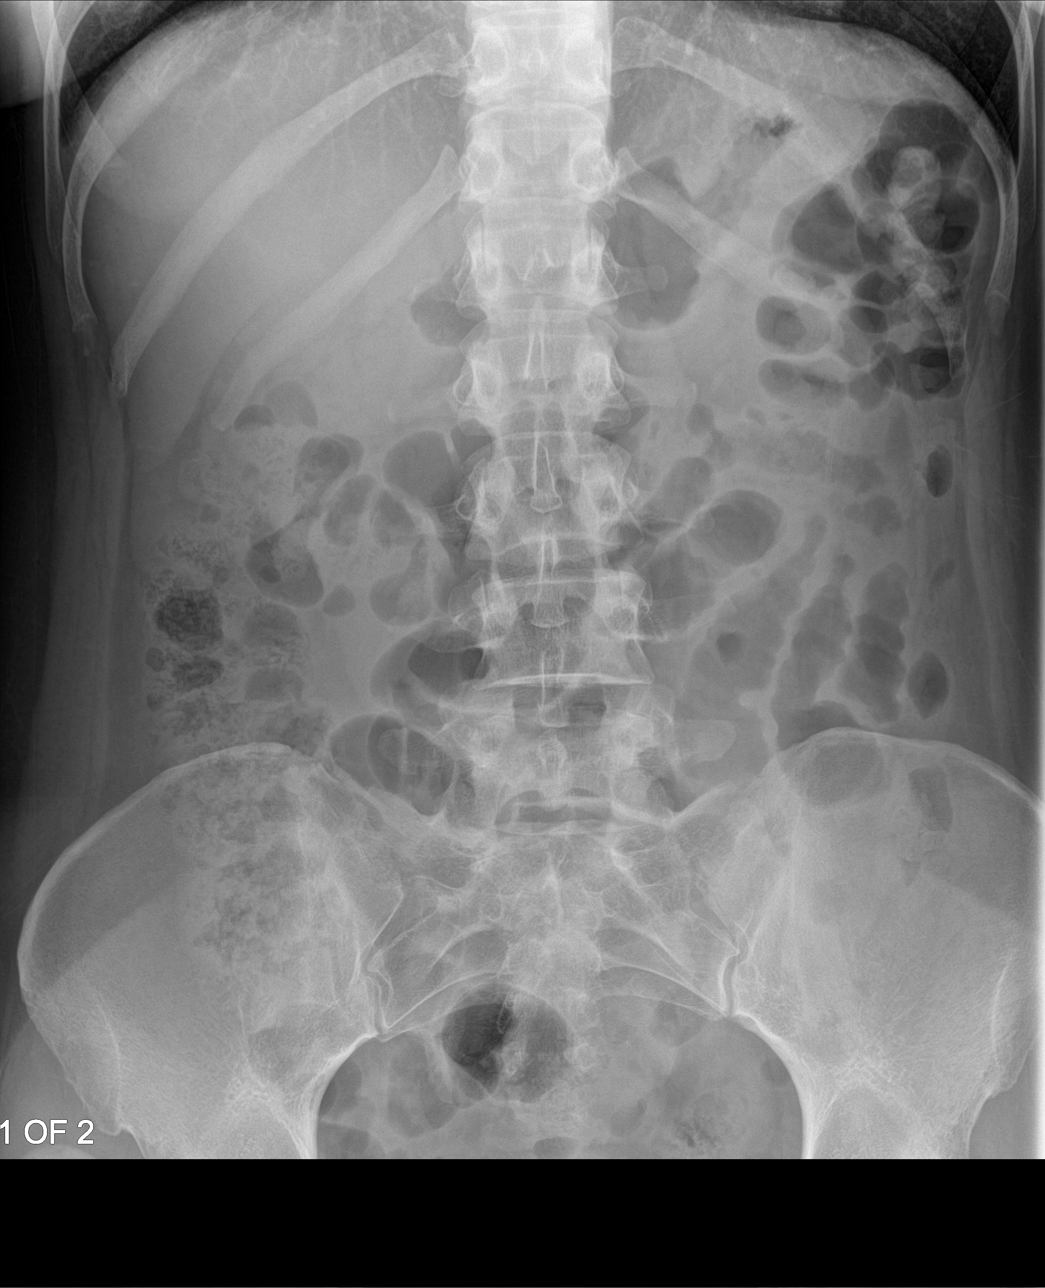
[im 4/4]
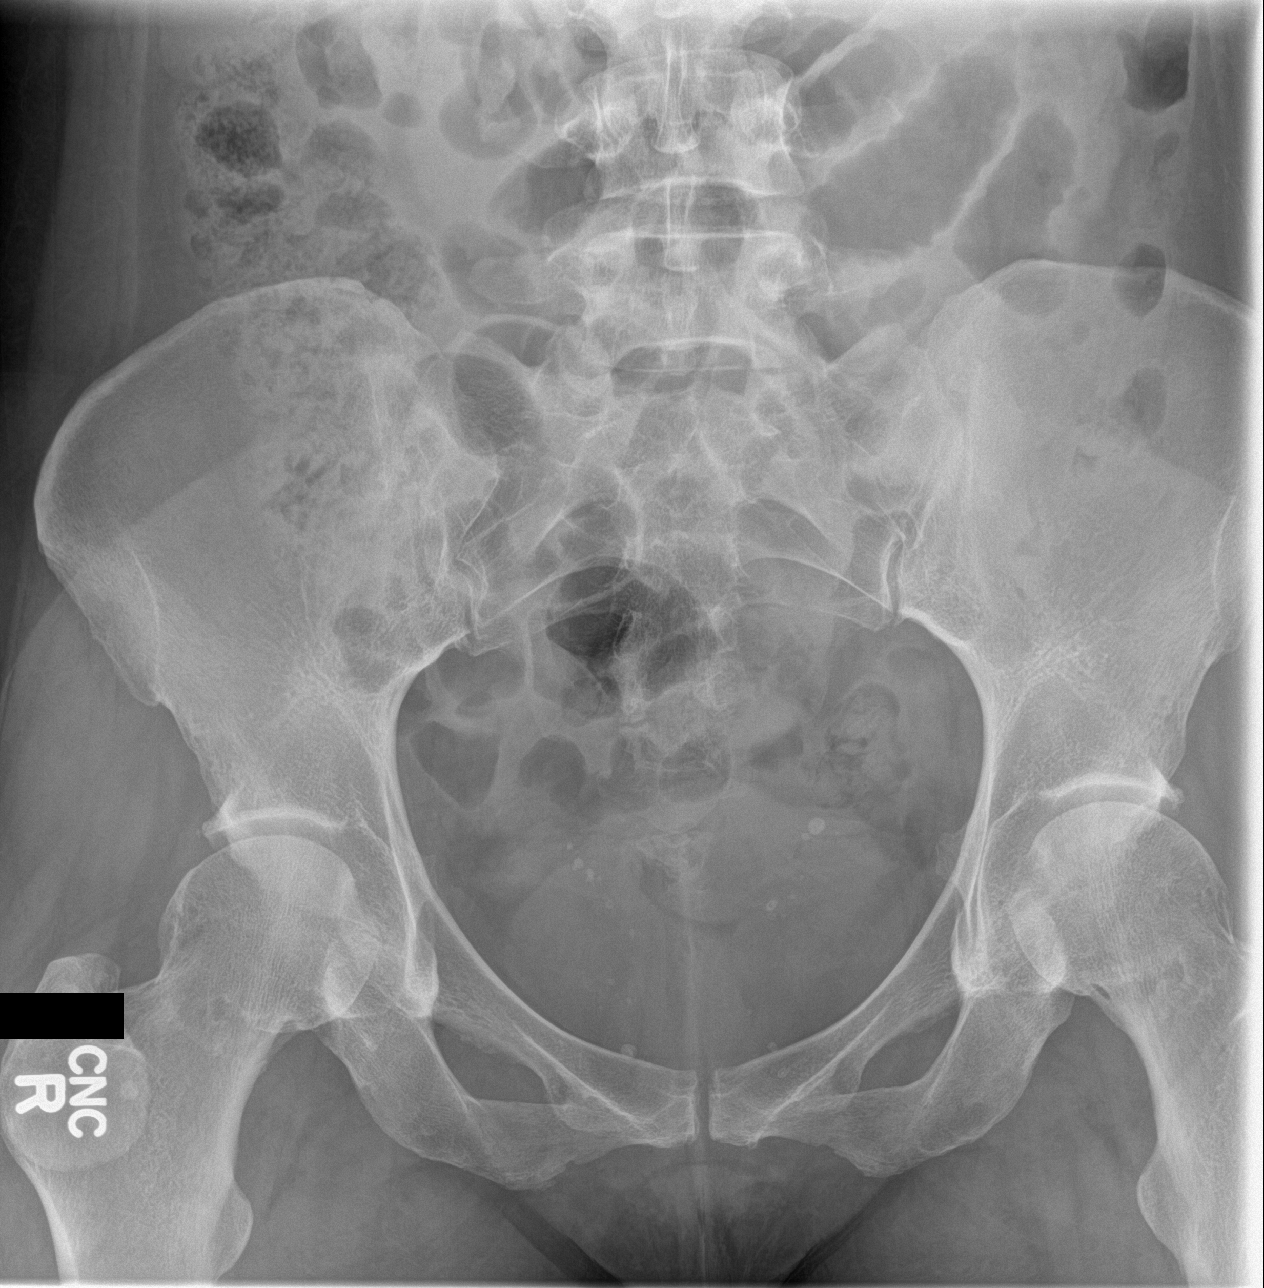

[4 of 4 positions shown; findings below may reference images not displayed]

FINDINGS: The lungs appear clear.  Cardiac and mediastinal contours normal.

No pleural effusion identified.

Several upper normal sized loops of left abdominal small bowel are
noted but without abnormal air-fluid levels an without overt
dilatation.

Vascular calcifications in the anatomic pelvis. Mild spurring of
both femoral heads.
IMPRESSION: 1. No significant bowel gas pattern abnormality is identified.
2. Mild spurring of both femoral heads.

## 2015-04-22 MED ORDER — LACTULOSE 20 G PO PACK: 20.0000 g | PACK | Freq: Three times a day (TID) | ORAL | Status: DC

## 2015-04-22 MED ORDER — SODIUM CHLORIDE 0.9 % IV BOLUS (SEPSIS)
500.0000 mL | Freq: Once | INTRAVENOUS | Status: AC
  Administered 2015-04-22: 500 mL via INTRAVENOUS

## 2015-04-22 MED ORDER — MORPHINE SULFATE (PF) 4 MG/ML IV SOLN
4.0000 mg | Freq: Once | INTRAVENOUS | Status: AC
  Administered 2015-04-22: 4 mg via INTRAVENOUS

## 2015-04-22 NOTE — ED Notes (Addendum)
Pt presents to ED with c/o back pain and lower abd pain that started Thursday that progressively has gotten worse. Pt states pain  Wraps around to her back. Denies n/v/d.

## 2015-04-22 NOTE — Discharge Instructions (Signed)
Return to the emergency room for any new or worrisome symptoms including increased pain, vomiting, fever, bleeding, or you feel worse in any way  Abdominal Pain, Adult Many things can cause abdominal pain. Usually, abdominal pain is not caused by a disease and will improve without treatment. It can often be observed and treated at home. Your health care provider will do a physical exam and possibly order blood tests and X-rays to help determine the seriousness of your pain. However, in many cases, more time must pass before a clear cause of the pain can be found. Before that point, your health care provider may not know if you need more testing or further treatment. HOME CARE INSTRUCTIONS Monitor your abdominal pain for any changes. The following actions may help to alleviate any discomfort you are experiencing:  Only take over-the-counter or prescription medicines as directed by your health care provider.  Do not take laxatives unless directed to do so by your health care provider.  Try a clear liquid diet (broth, tea, or water) as directed by your health care provider. Slowly move to a bland diet as tolerated. SEEK MEDICAL CARE IF:  You have unexplained abdominal pain.  You have abdominal pain associated with nausea or diarrhea.  You have pain when you urinate or have a bowel movement.  You experience abdominal pain that wakes you in the night.  You have abdominal pain that is worsened or improved by eating food.  You have abdominal pain that is worsened with eating fatty foods.  You have a fever. SEEK IMMEDIATE MEDICAL CARE IF:  Your pain does not go away within 2 hours.  You keep throwing up (vomiting).  Your pain is felt only in portions of the abdomen, such as the right side or the left lower portion of the abdomen.  You pass bloody or black tarry stools. MAKE SURE YOU:  Understand these instructions.  Will watch your condition.  Will get help right away if you are not  doing well or get worse.   This information is not intended to replace advice given to you by your health care provider. Make sure you discuss any questions you have with your health care provider.   Document Released: 10/01/2004 Document Revised: 09/12/2014 Document Reviewed: 08/31/2012 Elsevier Interactive Patient Education [employer]2016 [employer]

## 2015-04-22 NOTE — ED Provider Notes (Addendum)
[facility] Emergency Department Provider Note  ____________________________________________   I have reviewed the triage vital signs and the nursing notes.   HISTORY  Chief Complaint Back Pain and Abdominal Pain    HPI Cynthia Warren is a 40 y.o. female who presents today complaining of abdominal pain and back pain. Has had since Thursday. Patient is somewhat of a limited historian. Nothing seems to make it better and nothing seems to make it worse. She denies any nausea or vomiting. She denies any diarrhea. She denies any vaginal discharge. She states that her last menstrual period was last May. She denies pregnancy. She is not pregnant here by her labs. She has had no significant vaginal discharge, she is not socially active and has not been since February, 2 months ago. She denies any constipation or diarrhea. She states the pain is "everywhere" indicates from her epigastric region down. Said her back in her stomach. She denies any fever or lightheadedness. She can't recall the pain was sudden onset or not. He cannot give me any other alleviating or aggravating symptoms. As an aching pain she states.     History reviewed. No pertinent past medical history.  There are no active problems to display for this patient.   Past Surgical History  Procedure Laterality Date  . C section times 2      No current outpatient prescriptions on file.  Allergies Review of patient's allergies indicates no known allergies.  History reviewed. No pertinent family history.  Social History Social History  Substance Use Topics  . Smoking status: Current Some Day Smoker    Types: Cigarettes  . Smokeless tobacco: Never Used  . Alcohol Use: Yes     Comment: ocassionally    Review of Systems Constitutional: No fever/chills Eyes: No visual changes. ENT: No sore throat. No stiff neck no neck pain Cardiovascular: Denies chest pain. Respiratory: Denies shortness of  breath. Gastrointestinal:   no vomiting.  No diarrhea.  No constipation. Genitourinary: Negative for dysuria. Musculoskeletal: Negative lower extremity swelling Skin: Negative for rash. Neurological: Negative for headaches, focal weakness or numbness. 10-point ROS otherwise negative.  ____________________________________________   PHYSICAL EXAM:  VITAL SIGNS: ED Triage Vitals  Enc Vitals Group     BP 04/22/15 1416 115/102 mmHg     Pulse Rate 04/22/15 1416 72     Resp 04/22/15 1416 18     Temp 04/22/15 1416 98.7 F (37.1 C)     Temp Source 04/22/15 1416 Oral     SpO2 04/22/15 1416 99 %     Weight 04/22/15 1416 150 lb (68.04 kg)     Height 04/22/15 1416  (1.753 m)     Head Cir --      Peak Flow --      Pain Score --      Pain Loc --      Pain Edu? --      Excl. in GC? --     Constitutional: Alert and oriented. Well appearing and in no acute distress. Eyes: Conjunctivae are normal. PERRL. EOMI. Head: Atraumatic. Nose: No congestion/rhinnorhea. Mouth/Throat: Mucous membranes are moist.  Oropharynx non-erythematous. Neck: No stridor.   Nontender with no meningismus Cardiovascular: Normal rate, regular rhythm. Grossly normal heart sounds.  Good peripheral circulation. Respiratory: Normal respiratory effort.  No retractions. Lungs CTAB. Abdominal: Mildly diffusely tender but focally tender in the right upper quadrant which causes the patient to have voluntary guarding, nonsurgical abdomen soft with normal bowel movements. No  distention. No rebound  Back:  There is mild paraspinal muscle tenderness in lower back with no midline tenderness and no step off there is no lesions noted. there is no CVA tenderness Musculoskeletal: No lower extremity tenderness. No joint effusions, no DVT signs strong distal pulses no edema Neurologic:  Normal speech and language. No gross focal neurologic deficits are appreciated.  Skin:  Skin is warm, dry and intact. No rash noted. Psychiatric:  Mood and affect are normal. Speech and behavior are normal.  ____________________________________________   LABS (all labs ordered are listed, but only abnormal results are displayed)  Labs Reviewed  URINALYSIS COMPLETEWITH MICROSCOPIC ([facility] ONLY) - Abnormal; Notable for the following:    Color, Urine YELLOW (*)    APPearance CLEAR (*)    Hgb urine dipstick 1+ (*)    Bacteria, UA RARE (*)    Squamous Epithelial / LPF 0-5 (*)    All other components within normal limits  COMPREHENSIVE METABOLIC PANEL  CBC  LIPASE, BLOOD  POCT PREGNANCY, URINE   ____________________________________________  EKG  I personally interpreted any EKGs ordered by me or triage  ____________________________________________  RADIOLOGY  I reviewed any imaging ordered by me or triage that were performed during my shift and, if possible, patient and/or family made aware of any abnormal findings. ____________________________________________   PROCEDURES  Procedure(s) performed: None  Critical Care performed: None  ____________________________________________   INITIAL IMPRESSION / ASSESSMENT AND PLAN / ED COURSE  Pertinent labs & imaging results that were available during my care of the patient were reviewed by me and considered in my medical decision making (see chart for details).  Patient with diffuse pain "everywhere" but on exam the focality seems to be principally in the right upper quadrant. Her vital signs are reassuring her blood work is reassuring we will obtain an ultrasound of that region and reassess. She has no right lower quadrant pain, pain has been going on for 4 days nothing at this time to suggest appendicitis, given that she has focal pain in the epigastric and right upper quadrant, I have lower suspicion for ovarian or uterine pathology. Patient is not pregnant and therefore this is not likely to be an ectopic obviously. We will give her pain medications and IV fluids and obtain  imaging and reassess. Given the fact that she is not sexually active, has no significant lower pelvic pain, denies any vaginal discharge, lower suspicion for PID is present  ----------------------------------------- 5:07 PM on 04/22/2015 -----------------------------------------  Ultrasound is negative lower is negative I will send a negative abdomen remains benign, we did do a pelvic exam, cPelvic exam: Female nurse chaperone present, no external lesions noted, physiologic vaginal discharge noted with no purulent discharge, no cervical motion tenderness, no adnexal tenderness or mass, there is no significant uterine tenderness or mass. No vaginal bleeding  Patient states she has been having some constipation issues recently. Certainly at this time is no evidence of diverticulitis and appendicitis or volvulus gallbladder disease PID TOA ruptured ovarian cyst torsion or other life-threatening disease process in the abdomen we'll obtain a three-way of the abdomen and await the results of the quite normal-appearing pelvic exam. That is all reassuring we will try to get the patient home. Return precautions and follow-up given and understood. Patient advised not to drive after having received morphine. Not has been some time. She is quite awake and alert but we did advise her not to drive. Patient has no focal abdominal pain at this time, the transitory  epigastric and right upper quadrant pain is completely gone.  ----------------------------------------- 5:48 PM on 04/22/2015 -----------------------------------------  No indication for computed tomography at this time. Patient has a vague cramping abdominal pain which comes and goes and has been there for 4 days with normal white count and normal vital signs normal right upper quadrant ultrasound normal liver function tests normal lipase normal pelvic exam normal x-ray. I do note on the x-ray that does appear to be some right-sided stool burden which is  consistent at think with some of her symptoms. I will try a home test of laxatives to see if we can that her bowels to move better at think this will help her. Extensive return precautions and follow-up given and understood. ____________________________________________   FINAL CLINICAL IMPRESSION(S) / ED DIAGNOSES  Final diagnoses:  Abdominal pain      This chart was dictated using voice recognition software.  Despite best efforts to proofread,  errors can occur which can change meaning.     Jeanmarie PlantJames A Clarice Zulauf, MD 04/22/15 1540  Jeanmarie PlantJames A Idan Prime, MD 04/22/15 1708  Jeanmarie PlantJames A Divine Hansley, MD 04/22/15 1714  Jeanmarie PlantJames A Rowan Blaker, MD 04/22/15 [phone]

## 2017-05-24 ENCOUNTER — Emergency Department
Admission: EM | Admit: 2017-05-24 | Discharge: 2017-05-24 | Disposition: A | Discharge status: Home / Self Care | Payer: Self-pay | Attending: Emergency Medicine | Admitting: Emergency Medicine

## 2017-05-24 ENCOUNTER — Other Ambulatory Visit: Payer: Self-pay

## 2017-05-24 DIAGNOSIS — J0100 Acute maxillary sinusitis, unspecified: Secondary | ICD-10-CM | POA: Insufficient documentation

## 2017-05-24 DIAGNOSIS — F17210 Nicotine dependence, cigarettes, uncomplicated: Secondary | ICD-10-CM | POA: Insufficient documentation

## 2017-05-24 DIAGNOSIS — Z79899 Other long term (current) drug therapy: Secondary | ICD-10-CM | POA: Insufficient documentation

## 2017-05-24 DIAGNOSIS — J320 Chronic maxillary sinusitis: Secondary | ICD-10-CM

## 2017-05-24 DIAGNOSIS — G44209 Tension-type headache, unspecified, not intractable: Secondary | ICD-10-CM | POA: Insufficient documentation

## 2017-05-24 MED ORDER — BUTALBITAL-APAP-CAFFEINE 50-325-40 MG PO TABS: 1.0000 | ORAL_TABLET | Freq: Four times a day (QID) | ORAL | 0 refills | Status: AC | PRN

## 2017-05-24 MED ORDER — AMOXICILLIN-POT CLAVULANATE 875-125 MG PO TABS: 1.0000 | ORAL_TABLET | Freq: Two times a day (BID) | ORAL | 0 refills | Status: AC

## 2017-05-24 NOTE — ED Triage Notes (Signed)
C/o headache and pressure to left side of face, head and ear. States that it began Friday. Has taken ASA without relief. Pt alert and oriented X4, active, cooperative, pt in NAD. RR even and unlabored, color WNL.

## 2017-05-24 NOTE — ED Notes (Signed)
Pt c/o left sided ear, jaw and neck pain that radiates into shoulder. Pain and ache reported since Friday.

## 2017-05-24 NOTE — ED Provider Notes (Signed)
[facility] Emergency Department Provider Note  ____________________________________________  Time seen: Approximately 3:44 PM  I have reviewed the triage vital signs and the nursing notes.   HISTORY  Chief Complaint Headache and Facial Pain    HPI Cynthia Warren is a 42 y.o. female presents to the emergency department with left-sided maxillary and frontal sinus tenderness as well as pain along the left occipital region and pain that radiates into the left upper trapezius.  Patient denies malaise or purulent rhinorrhea.  She denies a history of seasonal allergies.  She has not experienced photophobia or phonophobia.  She denies the possibility of pregnancy.  She denies a history of chronic migraines.  She has been afebrile.  No alleviating measures have been attempted.    History reviewed. No pertinent past medical history.  There are no active problems to display for this patient.   Past Surgical History:  Procedure Laterality Date  . c section times 2      Prior to Admission medications   Medication Sig Start Date End Date Taking? Authorizing Provider  amoxicillin-clavulanate (AUGMENTIN) 875-125 MG tablet Take 1 tablet by mouth 2 (two) times daily for 10 days. 05/24/17 06/03/17  Orvil Feil, PA-C  butalbital-acetaminophen-caffeine (FIORICET, ESGIC) [phone] MG tablet Take 1 tablet by mouth every 6 (six) hours as needed for up to 7 days for headache. 05/24/17 05/31/17  Orvil Feil, PA-C  lactulose (CEPHULAC) 20 g packet Take 1 packet (20 g total) by mouth 3 (three) times daily. 04/22/15   Jeanmarie Plant, MD    Allergies Patient has no known allergies.  No family history on file.  Social History Social History   Tobacco Use  . Smoking status: Current Some Day Smoker    Types: Cigarettes  . Smokeless tobacco: Never Used  Substance Use Topics  . Alcohol use: Yes    Comment: ocassionally  . Drug use: Yes    Types: Marijuana      Review of Systems  Constitutional: No fever/chills Eyes: No visual changes. No discharge ENT: No upper respiratory complaints. Cardiovascular: no chest pain. Respiratory: no cough. No SOB. Gastrointestinal: No abdominal pain.  No nausea, no vomiting.  No diarrhea.  No constipation. Musculoskeletal: Negative for musculoskeletal pain. Skin: Negative for rash, abrasions, lacerations, ecchymosis. Neurological: Patient has maxillary and frontal sinus tenderness, no focal weakness or numbness.   ____________________________________________   PHYSICAL EXAM:  VITAL SIGNS: ED Triage Vitals [05/24/17 1515]  Enc Vitals Group     BP (!) 127/93     Pulse Rate 86     Resp 18     Temp 98.8 F (37.1 C)     Temp Source Oral     SpO2 97 %     Weight 180 lb (81.6 kg)     Height  (1.753 m)     Head Circumference      Peak Flow      Pain Score 10     Pain Loc      Pain Edu?      Excl. in GC?      Constitutional: Alert and oriented. Well appearing and in no acute distress. Eyes: Conjunctivae are normal. PERRL. EOMI. Head: Atraumatic.  Patient has maxillary and frontal sinus tenderness.  Patient reports an increase and facial pressure when she leans forward on physical exam. ENT:      Ears: TMs are effused bilaterally.      Nose: No congestion/rhinnorhea.  Mouth/Throat: Mucous membranes are moist.  Neck: No stridor. No cervical spine tenderness to palpation. Cardiovascular: Normal rate, regular rhythm. Normal S1 and S2.  Good peripheral circulation. Respiratory: Normal respiratory effort without tachypnea or retractions. Lungs CTAB. Good air entry to the bases with no decreased or absent breath sounds. Gastrointestinal: Bowel sounds 4 quadrants. Soft and nontender to palpation. No guarding or rigidity. No palpable masses. No distention. No CVA tenderness. Musculoskeletal: Full range of motion to all extremities. No gross deformities appreciated. Neurologic:  Normal speech  and language. No gross focal neurologic deficits are appreciated.  Skin:  Skin is warm, dry and intact. No rash noted. Psychiatric: Mood and affect are normal. Speech and behavior are normal. Patient exhibits appropriate insight and judgement.   ____________________________________________   LABS (all labs ordered are listed, but only abnormal results are displayed)  Labs Reviewed - No data to display ____________________________________________  EKG   ____________________________________________  RADIOLOGY   No results found.  ____________________________________________    PROCEDURES  Procedure(s) performed:    Procedures    Medications - No data to display   ____________________________________________   INITIAL IMPRESSION / ASSESSMENT AND PLAN / ED COURSE  Pertinent labs & imaging results that were available during my care of the patient were reviewed by me and considered in my medical decision making (see chart for details).  Review of the [place] CSRS was performed in accordance of the [facility] prior to dispensing any controlled drugs.     Assessment and plan Differential diagnosis included sinusitis, tension type headache and cluster headache Patient presents to the emergency department with left sided maxillary and frontal sinus tenderness along with left-sided occipital pain and radiation into the left upper trapezius.  On history, patient denied unilateral lacrimation or rhinorrhea, decreasing suspicion for cluster type headache.  Patient was treated empirically with Augmentin and Fioricet for combined likely tension type headache and sinusitis. All patient questions were answered.    ____________________________________________  FINAL CLINICAL IMPRESSION(S) / ED DIAGNOSES  Final diagnoses:  Maxillary sinusitis, unspecified chronicity  Acute non intractable tension-type headache      NEW MEDICATIONS STARTED DURING THIS VISIT:  ED Discharge Orders         Ordered    butalbital-acetaminophen-caffeine (FIORICET, ESGIC) 50-325-40 MG tablet  Every 6 hours PRN     05/24/17 1540    amoxicillin-clavulanate (AUGMENTIN) 875-125 MG tablet  2 times daily     05/24/17 1540          This chart was dictated using voice recognition software/Dragon. Despite best efforts to proofread, errors can occur which can change the meaning. Any change was purely unintentional.    Orvil Feil, PA-C 05/24/17 1551    Minna Antis, MD 05/24/17 2351

## 2017-11-28 ENCOUNTER — Emergency Department
Admission: EM | Admit: 2017-11-28 | Discharge: 2017-11-28 | Disposition: A | Discharge status: Home / Self Care | Payer: Self-pay | Attending: Emergency Medicine | Admitting: Emergency Medicine

## 2017-11-28 ENCOUNTER — Encounter: Payer: Self-pay | Admitting: Emergency Medicine

## 2017-11-28 DIAGNOSIS — M545 Low back pain, unspecified: Secondary | ICD-10-CM

## 2017-11-28 DIAGNOSIS — F17210 Nicotine dependence, cigarettes, uncomplicated: Secondary | ICD-10-CM | POA: Insufficient documentation

## 2017-11-28 DIAGNOSIS — Z79899 Other long term (current) drug therapy: Secondary | ICD-10-CM | POA: Insufficient documentation

## 2017-11-28 MED ORDER — CYCLOBENZAPRINE HCL 5 MG PO TABS: 5.0000 mg | ORAL_TABLET | Freq: Three times a day (TID) | ORAL | 0 refills | Status: DC | PRN

## 2017-11-28 MED ORDER — NAPROXEN 500 MG PO TABS: 500.0000 mg | ORAL_TABLET | Freq: Two times a day (BID) | ORAL | 0 refills | Status: DC

## 2017-11-28 MED ORDER — KETOROLAC TROMETHAMINE 30 MG/ML IJ SOLN
30.0000 mg | Freq: Once | INTRAMUSCULAR | Status: AC
  Administered 2017-11-28: 30 mg via INTRAMUSCULAR
  Filled 2017-11-28: qty 1

## 2017-11-28 NOTE — ED Notes (Signed)
See triage note  Presents with lower back pain  States pain radiates into both hips  ambulates well   Denies any injury

## 2017-11-28 NOTE — Discharge Instructions (Signed)
Please follow up with your primary care provider if not improving over the week.  Return to the ER for symptoms that change or worsen if unable to schedule an appointment. 

## 2017-11-28 NOTE — ED Triage Notes (Addendum)
Pt c/o low back pain that radiates down hips through to her legs. Pt states she also had recent cold sxs prior to back pain. Sxs started around Wed or Thursday.   Pt describes the pain as a crushing and aching pain. Denies any dysuria

## 2017-11-29 NOTE — ED Provider Notes (Signed)
[facility] Emergency Department Provider Note ____________________________________________  Time seen: Approximately 12:02 AM  I have reviewed the triage vital signs and the nursing notes.  HISTORY  Chief Complaint Back Pain   HPI Cynthia Warren is a 42 y.o. female who presents to the emergency department for treatment and evaluation of back pain.  She cannot recall any injury.  She has never had this type of pain before. Pain radiates into both legs.  She recently got over a cold and believes the pain may be coming from coughing.  No alleviating measures attempted prior to arrival.  History reviewed. No pertinent past medical history.  There are no active problems to display for this patient.   Past Surgical History:  Procedure Laterality Date  . c section times 2      Prior to Admission medications   Medication Sig Start Date End Date Taking? Authorizing Provider  cyclobenzaprine (FLEXERIL) 5 MG tablet Take 1 tablet (5 mg total) by mouth 3 (three) times daily as needed for muscle spasms. 11/28/17   Milaya Hora, Rulon Eisenmengerari B, FNP  lactulose (CEPHULAC) 20 g packet Take 1 packet (20 g total) by mouth 3 (three) times daily. 04/22/15   Jeanmarie PlantMcShane, James A, MD  naproxen (NAPROSYN) 500 MG tablet Take 1 tablet (500 mg total) by mouth 2 (two) times daily with a meal. 11/28/17   Aadvika Konen B, FNP    Allergies Patient has no known allergies.  History reviewed. No pertinent family history.  Social History Social History   Tobacco Use  . Smoking status: Current Some Day Smoker    Packs/day: 0.25    Types: Cigarettes  . Smokeless tobacco: Never Used  Substance Use Topics  . Alcohol use: Yes    Comment: ocassionally  . Drug use: Yes    Types: Marijuana    Review of Systems Constitutional: Well appearing. Respiratory: Negative for dyspnea. Cardiovascular: Negative for change in skin temperature or color. Musculoskeletal:   Negative for chronic steroid  use   Negative for trauma in the presence of osteoporosis  Negative for age over 1050 and trauma.  Negative for constitutional symptoms, or history of cancer   Negative for pain worse at night. Skin: Negative for rash, lesion, or wound.  Genitourinary: Negative for urinary retention. Rectal: Negative for fecal incontinence or new onset constipation/bowel habit changes. Hematological/Immunilogical: Negative for immunosuppression, IV drug use, or fever Neurological: Negative for burning, tingling, numb, electric, radiating pain in the right or left lower extremity.                        Negative for saddle anesthesia.                        Negative for focal neurologic deficit, progressive or disabling symptoms             Negative for saddle anesthesia. ____________________________________________   PHYSICAL EXAM:  VITAL SIGNS: ED Triage Vitals  Enc Vitals Group     BP 11/28/17 1424 132/83     Pulse Rate 11/28/17 1424 88     Resp --      Temp 11/28/17 1424 98.3 F (36.8 C)     Temp Source 11/28/17 1424 Oral     SpO2 11/28/17 1424 100 %     Weight 11/28/17 1425 180 lb (81.6 kg)     Height 11/28/17 1425 5\' 9"  (1.753 m)     Head Circumference --  Peak Flow --      Pain Score 11/28/17 1427 10     Pain Loc --      Pain Edu? --      Excl. in GC? --     Constitutional: Alert and oriented. Well appearing and in no acute distress. Eyes: Conjunctivae are clear without discharge or drainage.  Head: Atraumatic. Neck: Full, active range of motion. Respiratory: Respirations even and unlabored. Musculoskeletal: Full ROM of the back and extremities, Strength 5/5 of the lower extremities as tested. Neurologic: Reflexes of the lower extremities are 2+.  Negative straight leg raise on the right or left side. Skin: Atraumatic.  Psychiatric: Behavior and affect are normal.  ____________________________________________   LABS (all labs ordered are listed, but only abnormal results are  displayed)  Labs Reviewed - No data to display ____________________________________________  RADIOLOGY  Not indicated ____________________________________________   PROCEDURES  Procedure(s) performed:  Procedures ____________________________________________   INITIAL IMPRESSION / ASSESSMENT AND PLAN / ED COURSE  Cynthia Warren is a 42 y.o. female presents to the emergency department for treatment and evaluation of back pain for which she has attempted no alleviating measures prior to arrival.  While here, she was given an injection of Toradol with some decrease in pain.  She will be prescribed Flexeril and Naprosyn.  She is to follow-up with primary care provider for choice for symptoms are not improving over the next few days.  She can return to the emergency department for symptoms of change or worsen if unable to schedule an appointment.  Medications  ketorolac (TORADOL) 30 MG/ML injection 30 mg (30 mg Intramuscular Given 11/28/17 1524)    ED Discharge Orders         Ordered    naproxen (NAPROSYN) 500 MG tablet  2 times daily with meals     11/28/17 1556    cyclobenzaprine (FLEXERIL) 5 MG tablet  3 times daily PRN     11/28/17 1556           Pertinent labs & imaging results that were available during my care of the patient were reviewed by me and considered in my medical decision making (see chart for details).  _________________________________________   FINAL CLINICAL IMPRESSION(S) / ED DIAGNOSES  Final diagnoses:  Acute bilateral low back pain without sciatica     If controlled substance prescribed during this visit, 12 month history viewed on the [place] prior to issuing an initial prescription for Schedule II or III opiod.    Chinita Pester, FNP 11/29/17 Jorje Guild    Jene Every, MD 11/29/17 [phone]

## 2018-01-10 ENCOUNTER — Encounter (HOSPITAL_COMMUNITY): Payer: Self-pay | Admitting: *Deleted

## 2018-01-10 ENCOUNTER — Emergency Department (HOSPITAL_COMMUNITY)
Admission: EM | Admit: 2018-01-10 | Discharge: 2018-01-10 | Disposition: A | Discharge status: Home / Self Care | Payer: Self-pay | Attending: Emergency Medicine | Admitting: Emergency Medicine

## 2018-01-10 DIAGNOSIS — F17210 Nicotine dependence, cigarettes, uncomplicated: Secondary | ICD-10-CM | POA: Insufficient documentation

## 2018-01-10 DIAGNOSIS — Z79899 Other long term (current) drug therapy: Secondary | ICD-10-CM | POA: Insufficient documentation

## 2018-01-10 DIAGNOSIS — F1290 Cannabis use, unspecified, uncomplicated: Secondary | ICD-10-CM | POA: Insufficient documentation

## 2018-01-10 DIAGNOSIS — R1084 Generalized abdominal pain: Secondary | ICD-10-CM | POA: Insufficient documentation

## 2018-01-10 DIAGNOSIS — M549 Dorsalgia, unspecified: Secondary | ICD-10-CM | POA: Insufficient documentation

## 2018-01-10 DIAGNOSIS — R300 Dysuria: Secondary | ICD-10-CM | POA: Insufficient documentation

## 2018-01-10 LAB — URINALYSIS, ROUTINE W REFLEX MICROSCOPIC
Bilirubin Urine: NEGATIVE
Glucose, UA: NEGATIVE mg/dL
KETONES UR: NEGATIVE mg/dL
Leukocytes, UA: NEGATIVE
NITRITE: NEGATIVE
PH: 6 (ref 5.0–8.0)
Protein, ur: NEGATIVE mg/dL
SPECIFIC GRAVITY, URINE: 1.01 (ref 1.005–1.030)

## 2018-01-10 LAB — COMPREHENSIVE METABOLIC PANEL
ALT: 28 U/L (ref 0–44)
ANION GAP: 10 (ref 5–15)
AST: 29 U/L (ref 15–41)
Albumin: 4.3 g/dL (ref 3.5–5.0)
Alkaline Phosphatase: 88 U/L (ref 38–126)
BUN: 13 mg/dL (ref 6–20)
CALCIUM: 9.5 mg/dL (ref 8.9–10.3)
CHLORIDE: 100 mmol/L (ref 98–111)
CO2: 27 mmol/L (ref 22–32)
CREATININE: 0.9 mg/dL (ref 0.44–1.00)
GFR calc Af Amer: >60 mL/min (ref >60)
Glucose, Bld: 114 mg/dL — ABNORMAL HIGH (ref 70–99)
Potassium: 4 mmol/L (ref 3.5–5.1)
SODIUM: 137 mmol/L (ref 135–145)
Total Bilirubin: 0.5 mg/dL (ref 0.3–1.2)
Total Protein: 7.6 g/dL (ref 6.5–8.1)

## 2018-01-10 LAB — CBC
HEMATOCRIT: 40.5 % (ref 36.0–46.0)
Hemoglobin: 12.5 g/dL (ref 12.0–15.0)
MCH: 28.1 pg (ref 26.0–34.0)
MCHC: 30.9 g/dL (ref 30.0–36.0)
MCV: 91 fL (ref 80.0–100.0)
NRBC: 0 % (ref 0.0–0.2)
PLATELETS: 253 10*3/uL (ref 150–400)
RBC: 4.45 MIL/uL (ref 3.87–5.11)
RDW: 14 % (ref 11.5–15.5)
WBC: 9 10*3/uL (ref 4.0–10.5)

## 2018-01-10 LAB — LIPASE, BLOOD: LIPASE: 35 U/L (ref 11–51)

## 2018-01-10 LAB — PREGNANCY, URINE: Preg Test, Ur: NEGATIVE

## 2018-01-10 NOTE — ED Provider Notes (Signed)
[facility] EMERGENCY DEPARTMENT Provider Note   CSN: [id number] Arrival date & time: 01/10/18  1224     History   Chief Complaint Chief Complaint  Patient presents with  . Abdominal Pain    HPI Cynthia Warren is a 43 y.o. female who presents with abdominal pain and back pain. PMH significant for chronic back pain. She states that she's had chronic back pain for "awhile" but just started to have abdominal pain over the past 1 week. It is constant and is sometimes on the left and sometimes on the right side of her abdomen. It radiates to her back. Nothing makes it better or worse. It feels somewhat similar as to when she had pyelonephritis years ago. She has a "little" dysuria but otherwise urination is normal. She also has had a lot of gas and she has some discomfort with eating but no fever, chills, nausea, vomiting. She denies constipation. No vaginal discharge or bleeding. She saw her OBGYN and had a normal work up with them last Friday. She went to [facility] in November for back pain and was prescribed Flexeril and Naproxen but she hasn't been taking this regularly. She has had 2 C-sections.  HPI  History reviewed. No pertinent past medical history.  There are no active problems to display for this patient.   Past Surgical History:  Procedure Laterality Date  . c section times 2       OB History   No obstetric history on file.      Home Medications    Prior to Admission medications   Medication Sig Start Date End Date Taking? Authorizing Provider  cyclobenzaprine (FLEXERIL) 5 MG tablet Take 1 tablet (5 mg total) by mouth 3 (three) times daily as needed for muscle spasms. 11/28/17   Triplett, Rulon Eisenmenger B, FNP  lactulose (CEPHULAC) 20 g packet Take 1 packet (20 g total) by mouth 3 (three) times daily. 04/22/15   Jeanmarie Plant, MD  naproxen (NAPROSYN) 500 MG tablet Take 1 tablet (500 mg total) by mouth 2 (two) times daily with a meal. 11/28/17   Triplett, Kasandra Knudsen, FNP    Family History History reviewed. No pertinent family history.  Social History Social History   Tobacco Use  . Smoking status: Current Some Day Smoker    Packs/day: 0.25    Types: Cigarettes  . Smokeless tobacco: Never Used  Substance Use Topics  . Alcohol use: Yes    Comment: ocassionally  . Drug use: Yes    Types: Marijuana     Allergies   Patient has no known allergies.   Review of Systems Review of Systems  Constitutional: Negative for chills and fever.  Gastrointestinal: Positive for abdominal pain. Negative for blood in stool, diarrhea, nausea and vomiting.  Genitourinary: Positive for dysuria. Negative for difficulty urinating, frequency, pelvic pain, vaginal bleeding and vaginal discharge.  All other systems reviewed and are negative.    Physical Exam Updated Vital Signs BP (!) 137/96 (BP Location: Right Arm)   Pulse 96   Temp 98.7 F (37.1 C) (Oral)   Resp 20   SpO2 99%   Physical Exam Vitals signs and nursing note reviewed.  Constitutional:      General: She is not in acute distress.    Appearance: She is well-developed.     Comments: Calm and cooperative, NAD  HENT:     Head: Normocephalic and atraumatic.  Eyes:     General: No scleral icterus.  Right eye: No discharge.        Left eye: No discharge.     Conjunctiva/sclera: Conjunctivae normal.     Pupils: Pupils are equal, round, and reactive to light.  Neck:     Musculoskeletal: Normal range of motion.  Cardiovascular:     Rate and Rhythm: Normal rate and regular rhythm.  Pulmonary:     Effort: Pulmonary effort is normal. No respiratory distress.     Breath sounds: Normal breath sounds.  Abdominal:     General: Abdomen is flat. Bowel sounds are normal. There is no distension.     Palpations: Abdomen is soft.     Tenderness: There is generalized abdominal tenderness (mild, generalized). There is no right CVA tenderness or left CVA tenderness.  Musculoskeletal:      Comments: No midline tenderness. Mild paraspinal muscle tenderness bilaterally  Skin:    General: Skin is warm and dry.  Neurological:     Mental Status: She is alert and oriented to person, place, and time.  Psychiatric:        Behavior: Behavior normal.      ED Treatments / Results  Labs (all labs ordered are listed, but only abnormal results are displayed) Labs Reviewed  COMPREHENSIVE METABOLIC PANEL - Abnormal; Notable for the following components:      Result Value   Glucose, Bld 114 (*)    All other components within normal limits  URINALYSIS, ROUTINE W REFLEX MICROSCOPIC - Abnormal; Notable for the following components:   APPearance HAZY (*)    Hgb urine dipstick SMALL (*)    Bacteria, UA RARE (*)    All other components within normal limits  LIPASE, BLOOD  CBC  PREGNANCY, URINE    EKG None  Radiology No results found.  Procedures Procedures (including critical care time)  Medications Ordered in ED Medications - No data to display   Initial Impression / Assessment and Plan / ED Course  I have reviewed the triage vital signs and the nursing notes.  Pertinent labs & imaging results that were available during my care of the patient were reviewed by me and considered in my medical decision making (see chart for details).  43 year old female presents with generalized abdominal pain for the past week. She reports low back pain as well. BP is mildly elevated but otherwise vitals are normal. Abdomen is soft, benign. She has mild generalized tenderness. She had a recent pelvic and GYN work up which was normal so will not repeat this. Labs and UA are normal here. Pregnancy test was not ordered and this was added on. Discussed results with pt. I do not think we need further work up at this time. Will rx Bentyl and have her f/u with her doctor.   While awaiting pregnancy test, the patient left before receiving her d/c paperwork.  Final Clinical Impressions(s) / ED  Diagnoses   Final diagnoses:  Generalized abdominal pain    ED Discharge Orders    None       Bethel Born, PA-C 01/10/18 1904    Arby Barrette, MD 01/12/18 [id number]

## 2018-01-10 NOTE — ED Triage Notes (Signed)
Pt in c/o back pain for the last three weeks and then developed lower abdominal pain, history of pylo and is concerned she has it again, reports dysuria as well

## 2018-03-09 ENCOUNTER — Other Ambulatory Visit: Payer: Self-pay

## 2018-03-09 ENCOUNTER — Ambulatory Visit: Payer: Self-pay | Attending: Oncology | Admitting: *Deleted

## 2018-03-09 ENCOUNTER — Encounter: Payer: Self-pay | Admitting: *Deleted

## 2018-03-09 ENCOUNTER — Encounter (INDEPENDENT_AMBULATORY_CARE_PROVIDER_SITE_OTHER): Payer: Self-pay

## 2018-03-09 VITALS — BP 136/94 | HR 71 | Temp 98.4°F | Ht 70.0 in | Wt 186.5 lb

## 2018-03-09 DIAGNOSIS — N6452 Nipple discharge: Secondary | ICD-10-CM

## 2018-03-09 NOTE — Progress Notes (Signed)
  Subjective:     Patient ID: Cynthia Warren, female   DOB: 1975-02-05, 43 y.o.   MRN: [id number]  HPI   Review of Systems     Objective:   Physical Exam     Assessment:     43 year old Black female self referral to [facility] for clinical breast exam and baseline mammogram.  Patient complains of spontaneous clear right nipple discharge noted in her bra over the past couple of months.  She also complains of bilateral breast pain when lying down on her stomach.  The patient is very large breasted. Family history includes a maternal grandmother with breast cancer in her 95's.  On clinical breast exam there is no dominant mass, skin changes, lymphadenopathy or nipple discharge.  The patient was also unable to express any discharge. Taught self breast awareness.  Last pap on 01/07/18 was negative / negative.  Next pap will be due in 2025 per [facility]CCP guidelines.  Patient has been screened for eligibility.  She does not have any insurance, Medicare or Medicaid.  She also meets financial eligibility.  Hand-out given on the Affordable Care Act.  Risk Assessment    Risk Scores      03/09/2018   Last edited by: Alta Corning, CMA   5-year risk: 0.6 %   Lifetime risk: 9.6 %            Plan:     Will get bilateral diagnostic mammogram and ultrasound for the spontaneous nipple discharge.  Will follow-up per [facility] protocol.

## 2018-03-09 NOTE — Patient Instructions (Signed)
Gave patient hand-out, Women Staying Healthy, Active and Well from [facility], with education on breast health, pap smears, heart and colon health. 

## 2018-03-10 ENCOUNTER — Ambulatory Visit
Admission: RE | Admit: 2018-03-10 | Discharge: 2018-03-10 | Disposition: A | Discharge status: Home / Self Care | Payer: Self-pay | Source: Ambulatory Visit | Attending: Oncology | Admitting: Oncology

## 2018-03-10 DIAGNOSIS — N6452 Nipple discharge: Secondary | ICD-10-CM

## 2018-03-10 IMAGING — MG DIGITAL DIAGNOSTIC BILATERAL MAMMOGRAM WITH TOMO AND CAD
6 of 10 series · 6 of 30 positions shown · non-contrast
Comparison: None

CLINICAL DATA: Patient reports spontaneous clear discharge from the
RIGHT nipple. She reports seeing dime-sized spots of clear fluid
within her bra on 4 occasions over last 2 months.

EXAM:
DIGITAL DIAGNOSTIC BILATERAL MAMMOGRAM WITH CAD AND TOMO
ULTRASOUND RIGHT BREAST

[L MLO synth-2D]
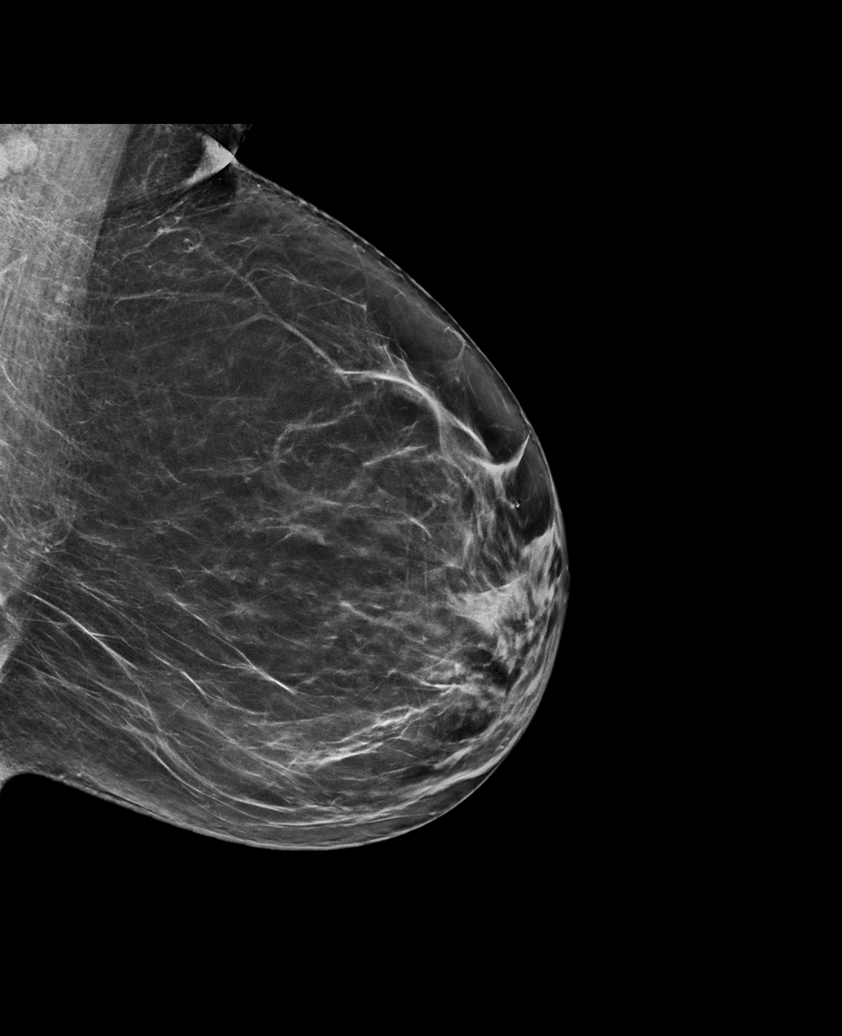

[R MLO synth-2D (1 of 2)]
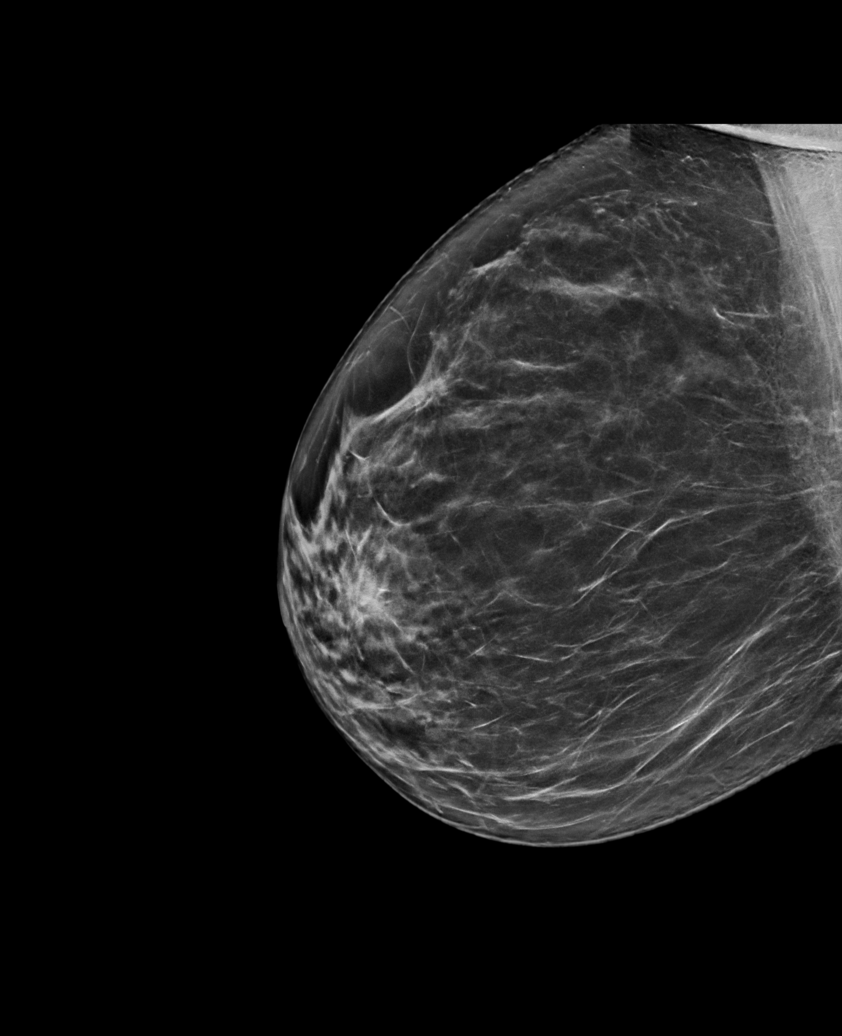

[L CC synth-2D]
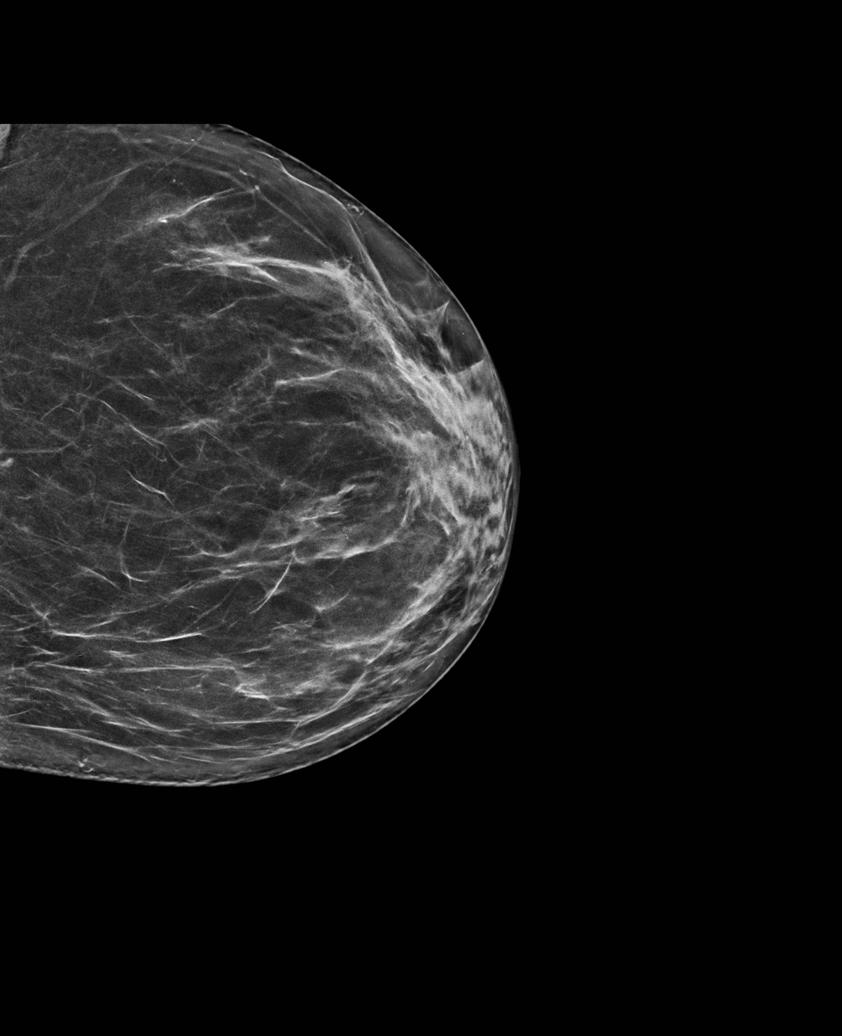

[R MLO synth-2D (2 of 2)]
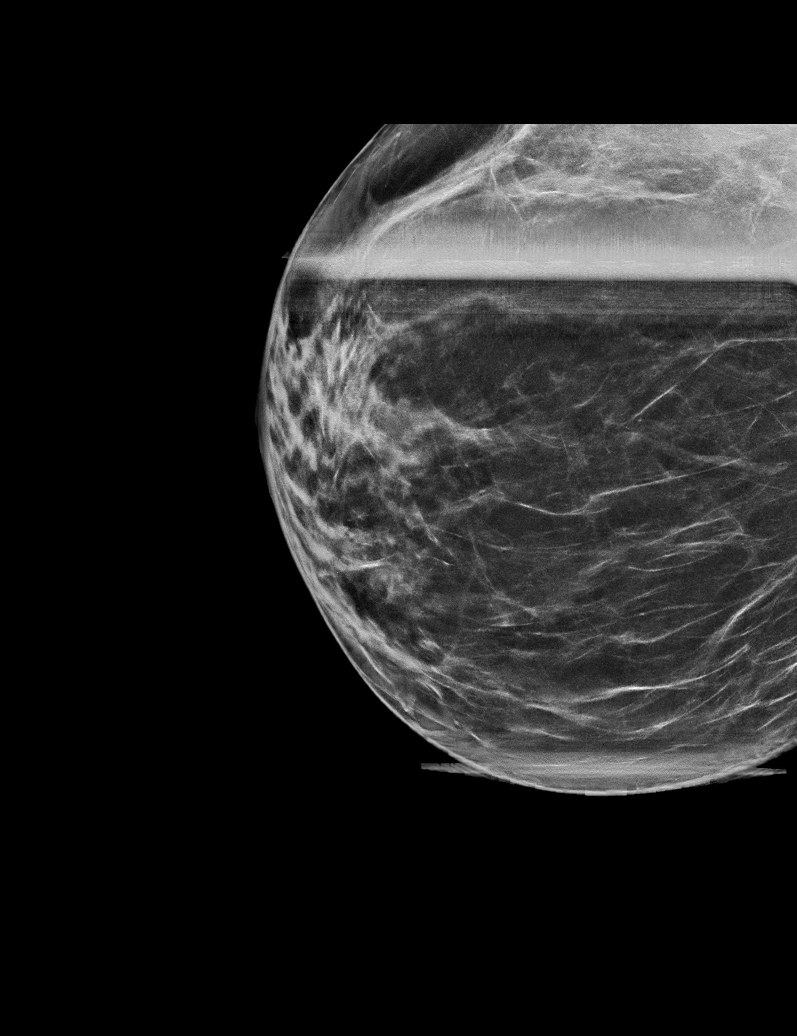

[R CC synth-2D]
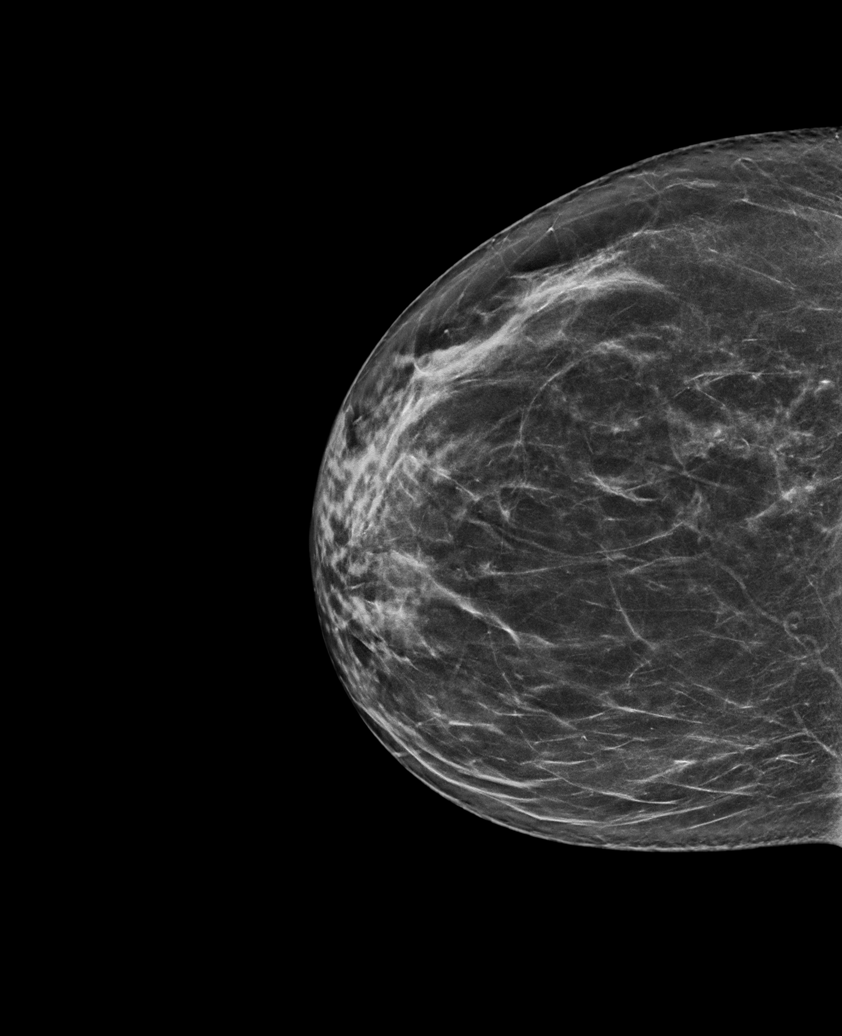

[L MLO tomo · tomo slice 39/78.0]
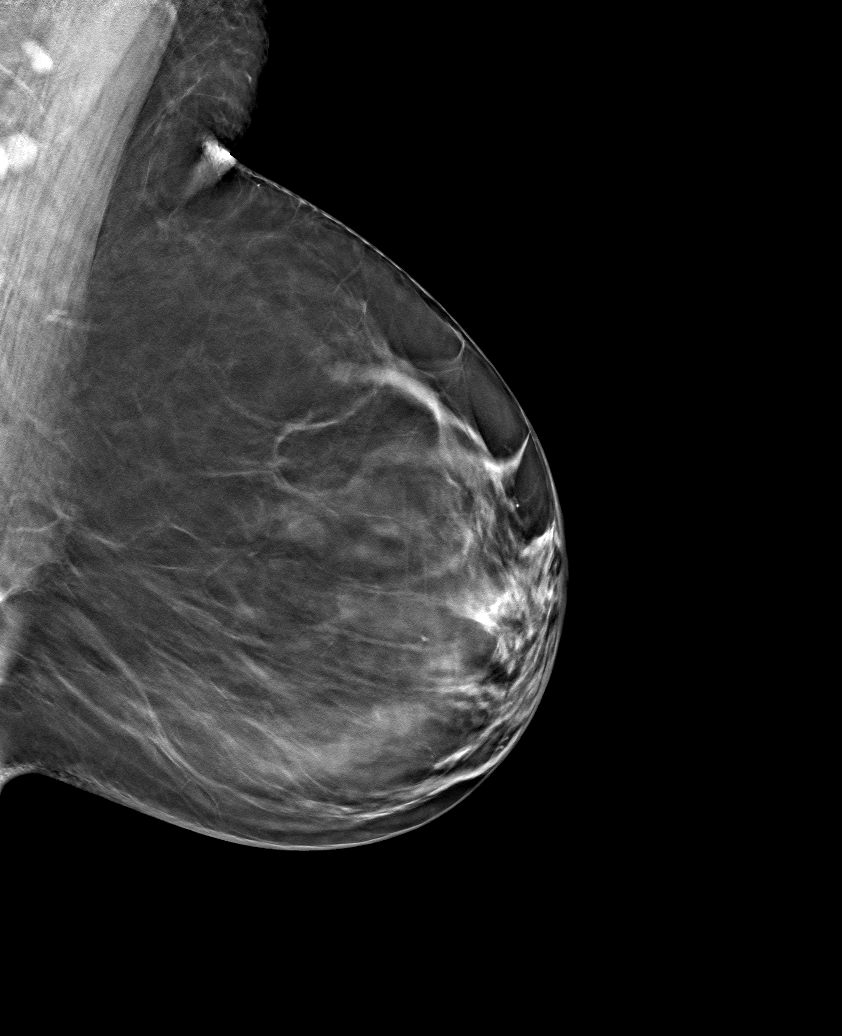

[6 of 30 positions shown; findings below may reference images not displayed]

ACR Breast Density Category b: There are scattered areas of
fibroglandular density.
FINDINGS: No suspicious mass, distortion, or microcalcifications are
identified to suggest presence of malignancy.

Mammographic images were processed with CAD.

On physical exam, I am not able to express any fluid from the RIGHT
nipple today.

Targeted ultrasound is performed, showing normal appearing
fibroglandular tissue in the retroareolar region of the RIGHT
breast. No suspicious mass, distortion, or acoustic shadowing is
demonstrated with ultrasound.
IMPRESSION: No mammographic or ultrasound evidence for malignancy.

Spontaneous clear nipple discharge from the RIGHT breast warrants
further evaluation.

RECOMMENDATION:
Recommend bilateral breast MRI with and without contrast.

Recommend surgical consultation to discuss nipple discharge.

I have discussed the findings and recommendations with the patient.
Results were also provided in writing at the conclusion of the
visit. If applicable, a reminder letter will be sent to the patient
regarding the next appointment.

BI-RADS CATEGORY  1: Negative.

## 2018-03-10 IMAGING — US ULTRASOUND RIGHT BREAST LIMITED
1 series · 2 of 2 positions shown · non-contrast
Comparison: None

CLINICAL DATA: Patient reports spontaneous clear discharge from the
RIGHT nipple. She reports seeing dime-sized spots of clear fluid
within her bra on 4 occasions over last 2 months.

EXAM:
DIGITAL DIAGNOSTIC BILATERAL MAMMOGRAM WITH CAD AND TOMO
ULTRASOUND RIGHT BREAST

[Series 1: ultrasound right breast limited · 0.06mm/px · 2 of 2 slices shown]
[im 1/2]
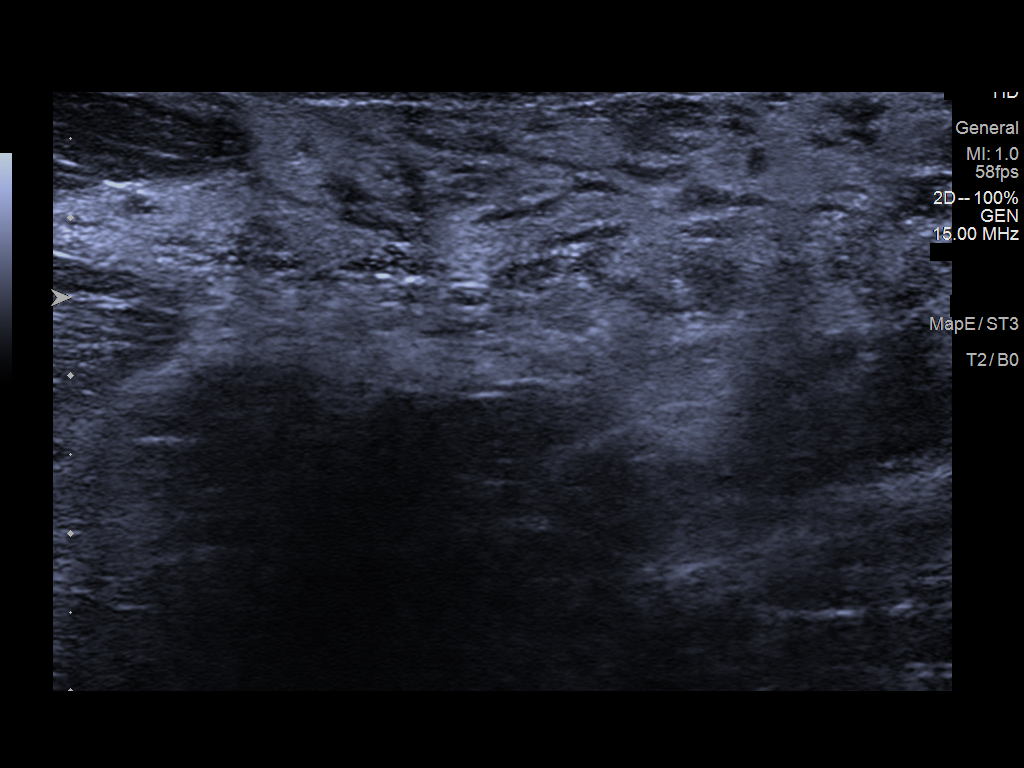
[im 2/2]
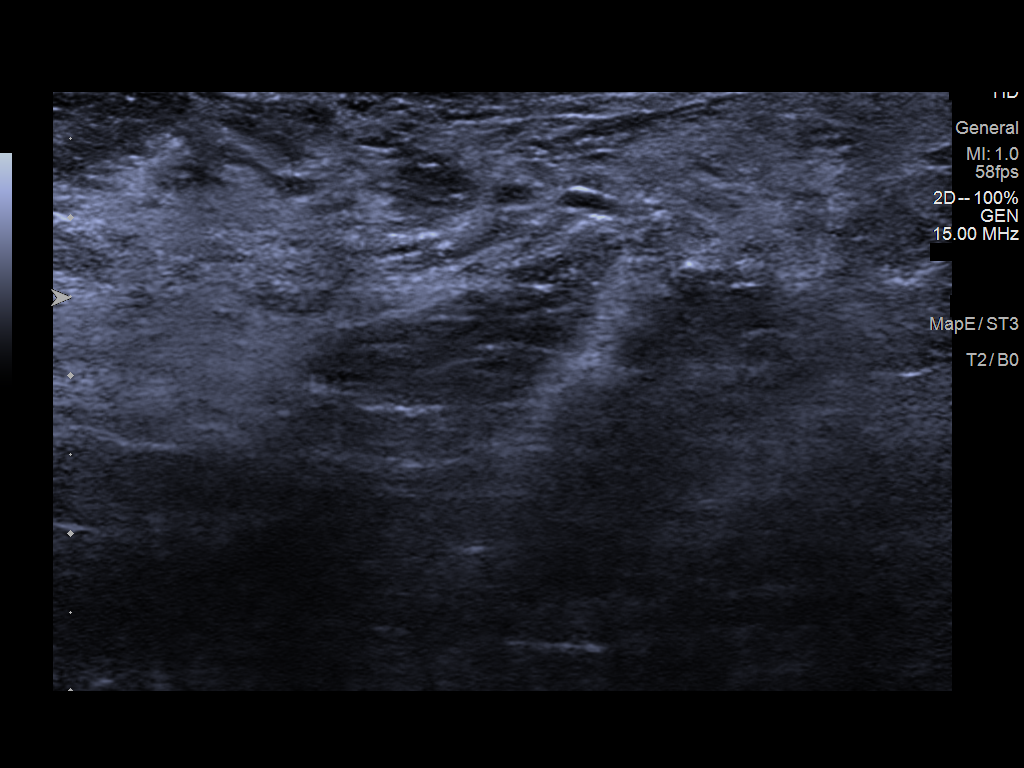

[2 of 2 positions shown; findings below may reference images not displayed]

ACR Breast Density Category b: There are scattered areas of
fibroglandular density.
FINDINGS: No suspicious mass, distortion, or microcalcifications are
identified to suggest presence of malignancy.

Mammographic images were processed with CAD.

On physical exam, I am not able to express any fluid from the RIGHT
nipple today.

Targeted ultrasound is performed, showing normal appearing
fibroglandular tissue in the retroareolar region of the RIGHT
breast. No suspicious mass, distortion, or acoustic shadowing is
demonstrated with ultrasound.
IMPRESSION: No mammographic or ultrasound evidence for malignancy.

Spontaneous clear nipple discharge from the RIGHT breast warrants
further evaluation.

RECOMMENDATION:
Recommend bilateral breast MRI with and without contrast.

Recommend surgical consultation to discuss nipple discharge.

I have discussed the findings and recommendations with the patient.
Results were also provided in writing at the conclusion of the
visit. If applicable, a reminder letter will be sent to the patient
regarding the next appointment.

BI-RADS CATEGORY  1: Negative.

## 2018-03-14 ENCOUNTER — Encounter: Payer: Self-pay | Admitting: *Deleted

## 2018-03-14 NOTE — Progress Notes (Signed)
Tried to call patient, but her phone just rings busy.  I would like to offer a surgical consult.  Radiology has recommended an MRI and surgical consult for the nipple discharge.

## 2018-03-21 ENCOUNTER — Other Ambulatory Visit: Payer: Self-pay | Admitting: *Deleted

## 2018-03-21 ENCOUNTER — Encounter: Payer: Self-pay | Admitting: *Deleted

## 2018-03-21 DIAGNOSIS — N6452 Nipple discharge: Secondary | ICD-10-CM

## 2018-03-21 NOTE — Progress Notes (Signed)
Talked to patient today and reviewed need for MRI and surgical consult for the spontaneous nipple discharge.  I have put in the order for the MRI.  Patient aware after her MRI I will set up appointment for a surgical consult.  She is agreeable.

## 2018-03-28 ENCOUNTER — Ambulatory Visit
Admission: RE | Admit: 2018-03-28 | Discharge: 2018-03-28 | Disposition: A | Discharge status: Home / Self Care | Payer: Self-pay | Source: Ambulatory Visit | Attending: Oncology | Admitting: Oncology

## 2018-03-28 ENCOUNTER — Other Ambulatory Visit: Payer: Self-pay

## 2018-03-28 DIAGNOSIS — N6452 Nipple discharge: Secondary | ICD-10-CM

## 2018-03-28 IMAGING — MR MR BILATERAL BREAST WITHOUT AND WITH CONTRAST
2 of 9 series · 6 of 48 positions shown · IV contrast (Gadavist)
Comparison: Previous exam(s).

CLINICAL DATA: Patient with history of spontaneous clear discharge
from the right nipple. Patient had diagnostic mammogram and
ultrasound [DATE].

EXAM:
BILATERAL BREAST MRI WITH AND WITHOUT CONTRAST
TECHNIQUE: Multiplanar, multisequence MR images of both breasts were obtained
prior to and following the intravenous administration of 7 ml of
Gadavist

[Series 4: T1 · axial · B · 1.5mm · 1.02mm/px · z∈[-40,+126]mm · 5 of 112 slices shown]
[im 1/112]
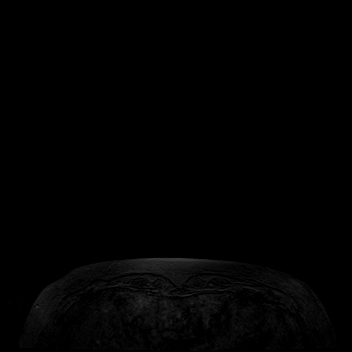
[im 28/112]
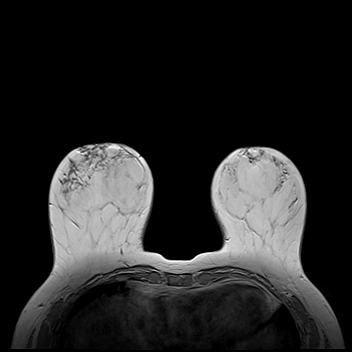
[im 56/112]
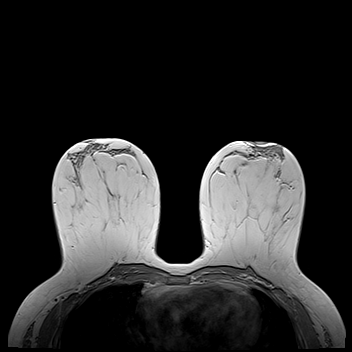
[im 84/112]
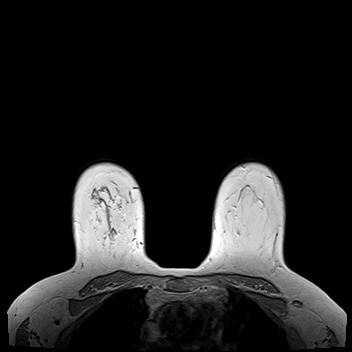
[im 112/112]
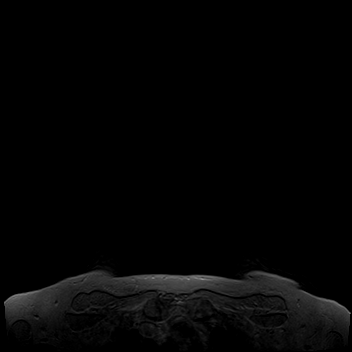

[Series 5: T2 · axial · B · 3.0mm · 1.02mm/px · 1 of 40 slices shown]
[im 1/40]
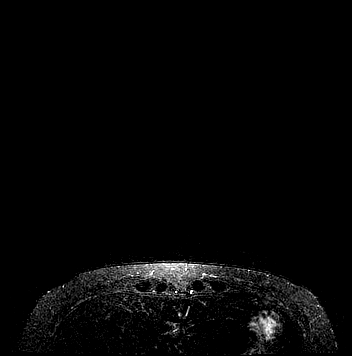

[6 of 48 positions shown; findings below may reference images not displayed]

Three-dimensional MR images were rendered by post-processing of the
original MR data on an independent workstation. The
three-dimensional MR images were interpreted, and findings are
reported in the following complete MRI report for this study. Three
dimensional images were evaluated at the independent [employer]
workstation
FINDINGS: Breast composition: a. Almost entirely fat.

Background parenchymal enhancement: Mild

Right breast: Within the central aspect of the right breast at the
level of the nipple there is a 9 x 4 mm lobular enhancing mass with
associated T2 signal. No additional enhancement within the right
breast.

Left breast: No mass or abnormal enhancement.

Lymph nodes: No abnormal appearing lymph nodes.

Ancillary findings:  None.
IMPRESSION: Indeterminate enhancing mass within the central right breast.

RECOMMENDATION:
MRI guided core needle biopsy indeterminate enhancing mass central
right breast.

BI-RADS CATEGORY  4: Suspicious.

## 2018-03-28 MED ORDER — GADOBUTROL 1 MMOL/ML IV SOLN
7.0000 mL | Freq: Once | INTRAVENOUS | Status: AC | PRN
  Administered 2018-03-28: 7 mL via INTRAVENOUS

## 2018-03-28 MED ORDER — GADOBUTROL 1 MMOL/ML IV SOLN: 8.0000 mL | Freq: Once | INTRAVENOUS | Status: DC | PRN

## 2018-04-04 ENCOUNTER — Other Ambulatory Visit: Payer: Self-pay | Admitting: *Deleted

## 2018-04-04 ENCOUNTER — Encounter: Payer: Self-pay | Admitting: *Deleted

## 2018-04-04 ENCOUNTER — Other Ambulatory Visit: Payer: Self-pay | Admitting: Oncology

## 2018-04-04 DIAGNOSIS — N63 Unspecified lump in unspecified breast: Secondary | ICD-10-CM

## 2018-04-04 NOTE — Progress Notes (Signed)
Called and spoke to patient today.  Reviewed MRI results and need for MRI guided biopsy.  She has been scheduled for 04/11/18 @ 7:00 am at [facility] in [place]. Coralee Rud, RN to fax Shiela Mayer approval form for payment.

## 2018-04-11 ENCOUNTER — Other Ambulatory Visit: Payer: Self-pay

## 2018-04-11 ENCOUNTER — Ambulatory Visit
Admission: RE | Admit: 2018-04-11 | Discharge: 2018-04-11 | Disposition: A | Discharge status: Home / Self Care | Payer: No Typology Code available for payment source | Source: Ambulatory Visit | Attending: Oncology | Admitting: Oncology

## 2018-04-11 DIAGNOSIS — N63 Unspecified lump in unspecified breast: Secondary | ICD-10-CM

## 2018-04-11 IMAGING — MR MR BREAST BX W LOC DEV 1ST LESION IMAGE BX SPEC MR GUIDE*R*
7 of 10 series · 33 of 48 positions shown · IV contrast (7ml gadavist)
Comparison: Breast MRI [DATE]
COMPARISON: Breast MRI [DATE]

Addendum:
CLINICAL DATA: MRI guided biopsy was recommended a 9 x 4 mm
enhancing mass in the central right breast.

EXAM:
MRI GUIDED CORE NEEDLE BIOPSY OF THE RIGHT BREAST
TECHNIQUE: Multiplanar, multisequence MR imaging of the right breast was
performed both before and after administration of intravenous
contrast.
CONTRAST:  7 mL Gadavist

[Series 2: fiducial unilateral · sagittal · 2.0mm · 1.33mm/px · 3 of 60 slices shown]
[im 1/60]
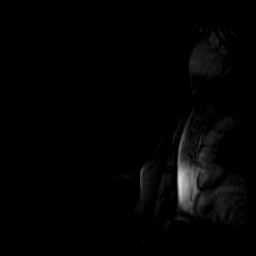
[im 30/60]
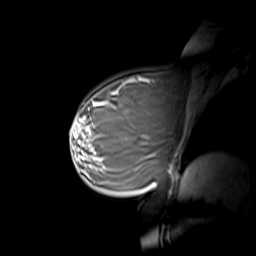
[im 60/60]
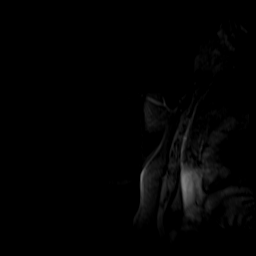

[Series 3: dynamic pre · axial · non-contrast · 1.3mm · 0.73mm/px · z∈[-98,+88]mm · 5 of 144 slices shown]
[im 1/144]
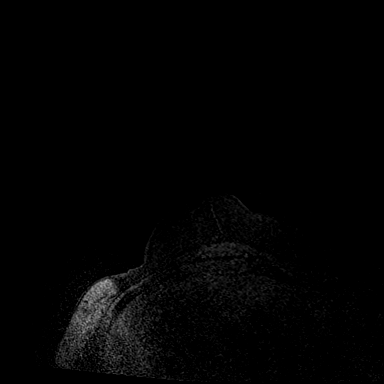
[im 36/144]
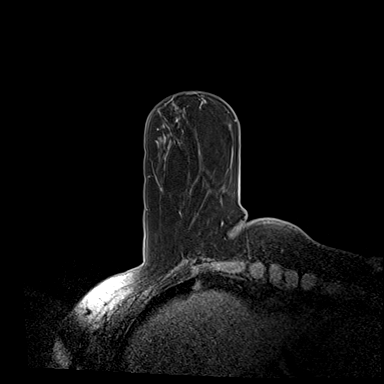
[im 72/144]
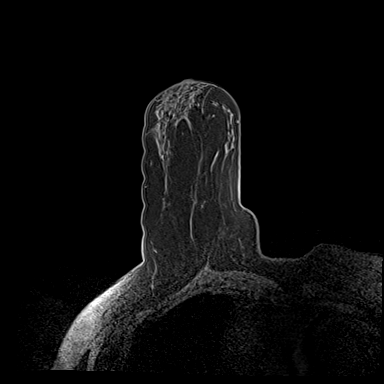
[im 108/144]
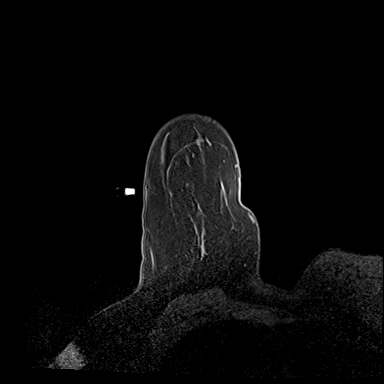
[im 144/144]
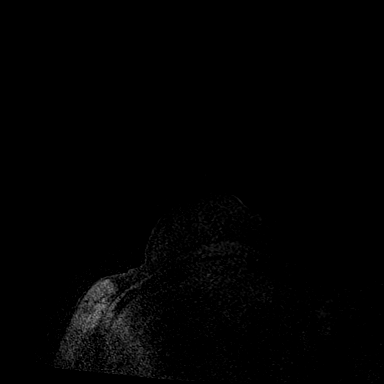

[Series 4: dynamic post 20 · axial · 1.3mm · 0.73mm/px · z∈[-98,+88]mm · 5 of 144 slices shown (1 of 2)]
[im 1/144]
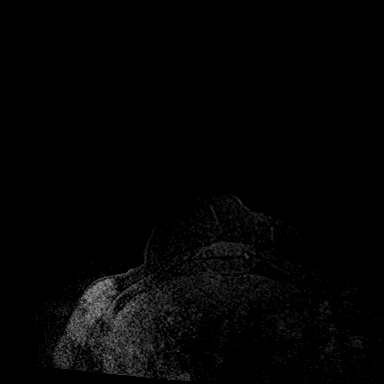
[im 36/144]
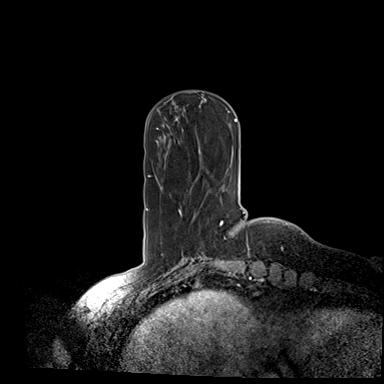
[im 72/144]
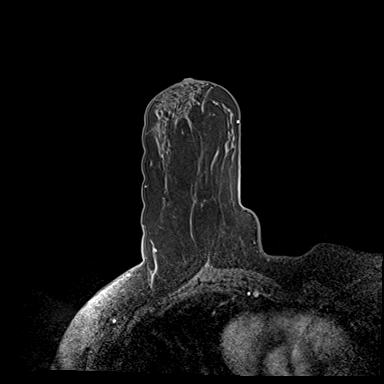
[im 108/144]
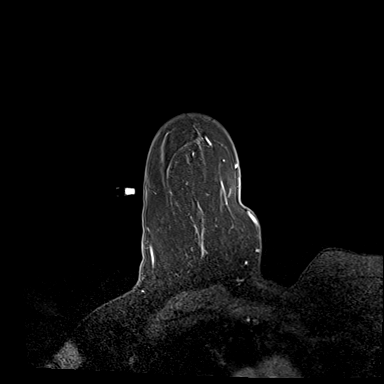
[im 144/144]
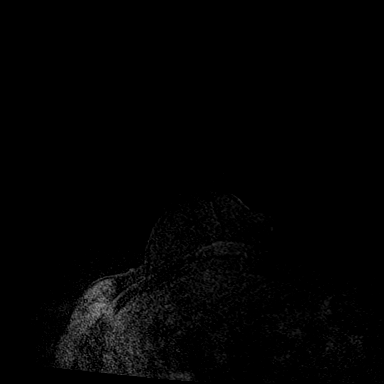

[Series 5: dynamic post 20 · axial · 1.3mm · 0.73mm/px · z∈[-98,+88]mm · 5 of 144 slices shown (2 of 2)]
[im 1/144]
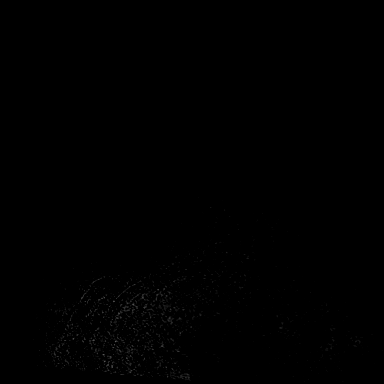
[im 36/144]
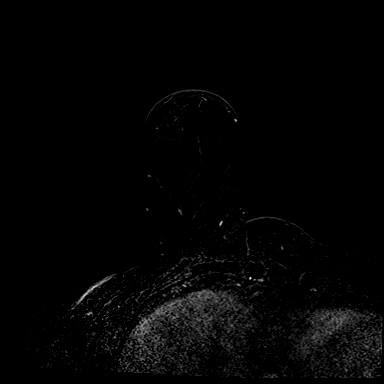
[im 72/144]
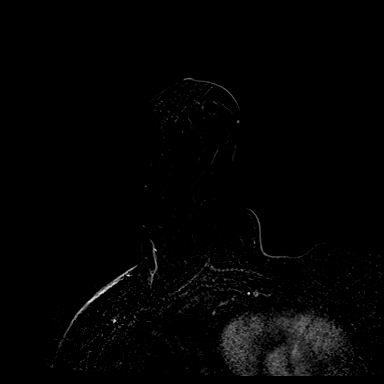
[im 108/144]
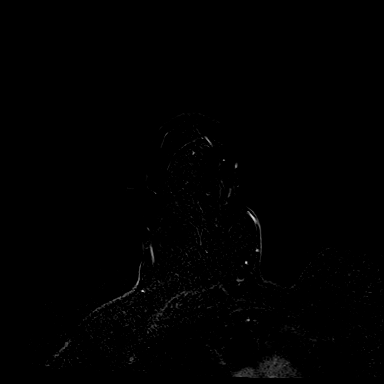
[im 144/144]
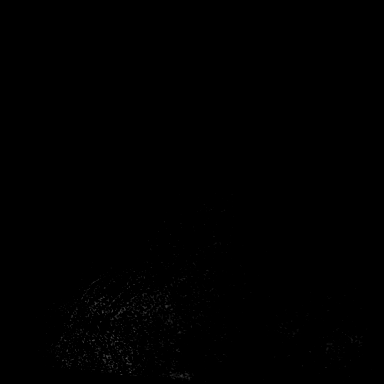

[Series 6: dynamic post 3 · axial · 1.3mm · 0.73mm/px · z∈[-98,+88]mm · 5 of 144 slices shown (1 of 2)]
[im 1/144]
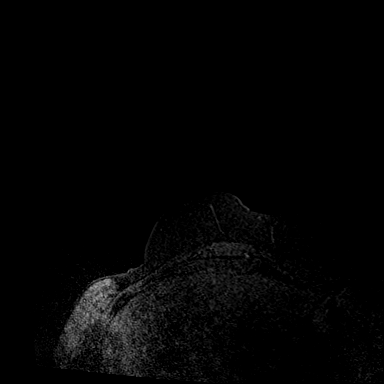
[im 36/144]
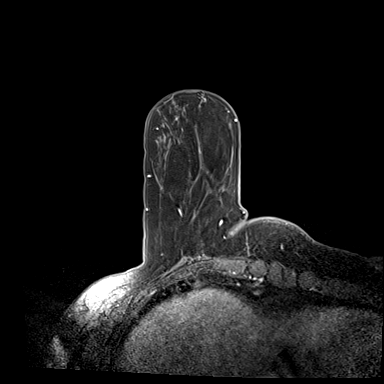
[im 72/144]
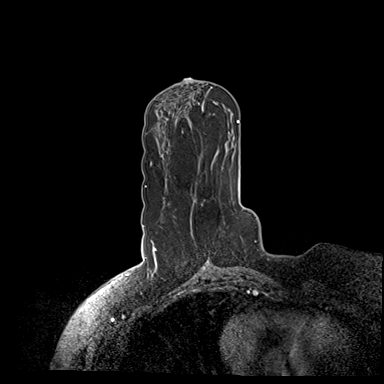
[im 108/144]
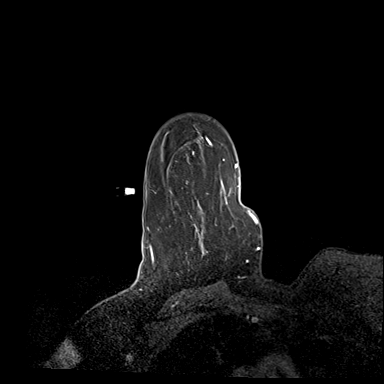
[im 144/144]
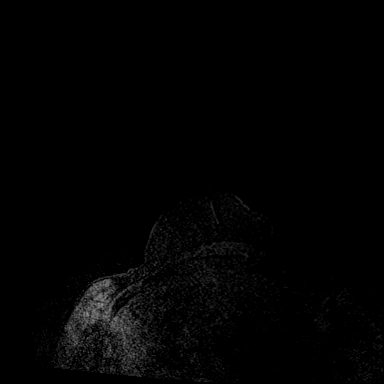

[Series 7: dynamic post 3 · axial · 1.3mm · 0.73mm/px · z∈[-98,+88]mm · 5 of 144 slices shown (2 of 2)]
[im 1/144]
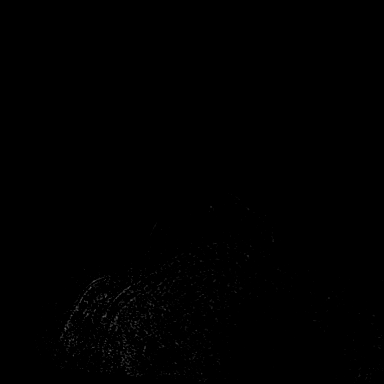
[im 36/144]
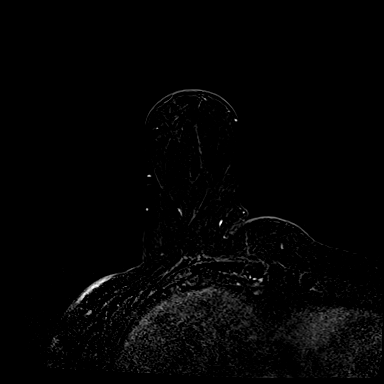
[im 72/144]
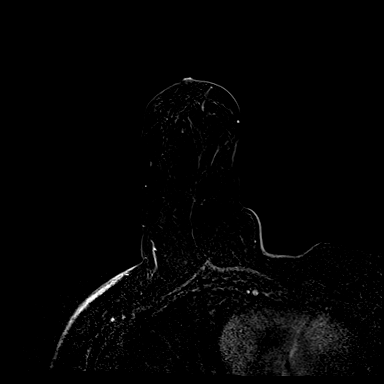
[im 108/144]
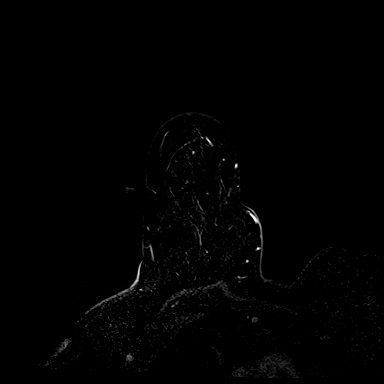
[im 144/144]
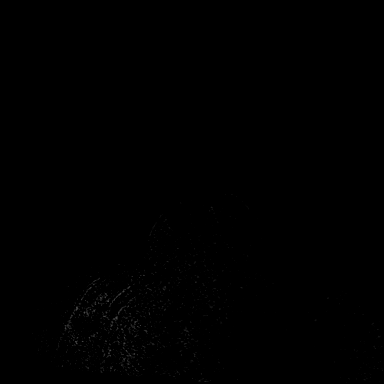

[Series 8: needle confirmation · axial · 1.3mm · 0.73mm/px · z∈[-98,+88]mm · 5 of 144 slices shown]
[im 1/144]
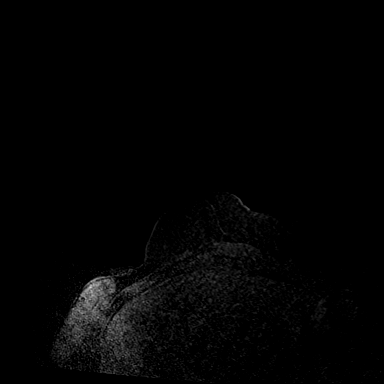
[im 36/144]
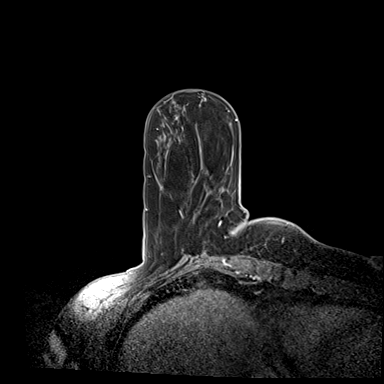
[im 72/144]
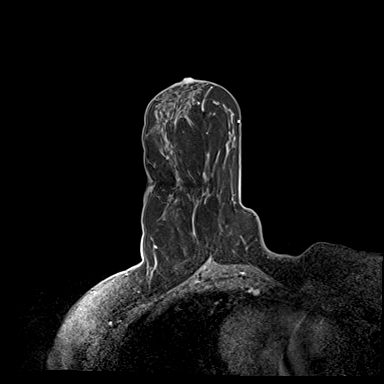
[im 108/144]
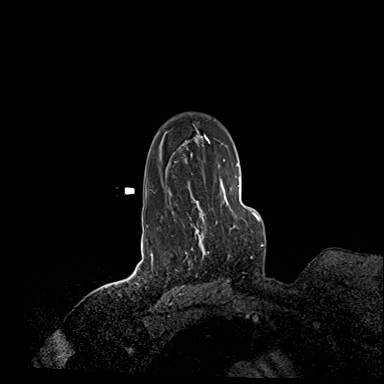
[im 144/144]
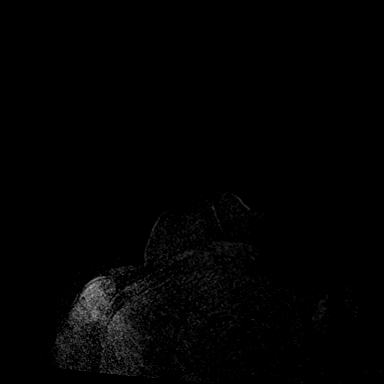

[33 of 48 positions shown; findings below may reference images not displayed]

FINDINGS: I met with the patient, and we discussed the procedure of MRI guided
biopsy, including risks, benefits, and alternatives. Specifically,
we discussed the risks of infection, bleeding, tissue injury, clip
migration, and inadequate sampling. Informed, written consent was
given. The usual time out protocol was performed immediately prior
to the procedure.

Using sterile technique, 1% Lidocaine, MRI guidance, and a 9 gauge
vacuum assisted device, biopsy was performed of an elongate oval
enhancing nodule in the central right breast using a lateral
approach. At the conclusion of the procedure, a barbell tissue
marker clip was deployed into the biopsy cavity. Follow-up 2-view
mammogram was performed and dictated separately.
IMPRESSION: MRI guided biopsy of the right breast.  No apparent complications.

ADDENDUM:
Pathology revealed BENIGN BREAST TISSUE of the RIGHT breast,
central. This was found to be concordant by Dr. JEFIMAS.

Pathology results were discussed with the patient by telephone. The
patient reported doing well after the biopsy with tenderness at the
site. Post biopsy instructions and care were reviewed and questions
were answered.

Surgical consultation for RIGHT nipple discharge to be arranged by
JEFIMAS,RT R, [HOSPITAL] Supervisor at
[HOSPITAL] [REDACTED]. If no surgery is performed,
6 month follow-up breast MRI is suggested.

Pathology results reported by JEFIMAS, RN on [DATE].

*** End of Addendum ***
FINDINGS: I met with the patient, and we discussed the procedure of MRI guided
biopsy, including risks, benefits, and alternatives. Specifically,
we discussed the risks of infection, bleeding, tissue injury, clip
migration, and inadequate sampling. Informed, written consent was
given. The usual time out protocol was performed immediately prior
to the procedure.

Using sterile technique, 1% Lidocaine, MRI guidance, and a 9 gauge
vacuum assisted device, biopsy was performed of an elongate oval
enhancing nodule in the central right breast using a lateral
approach. At the conclusion of the procedure, a barbell tissue
marker clip was deployed into the biopsy cavity. Follow-up 2-view
mammogram was performed and dictated separately.
IMPRESSION: MRI guided biopsy of the right breast.  No apparent complications.

## 2018-04-11 IMAGING — MG MM CLIP PLACEMENT
4 series · 4 of 12 positions shown · non-contrast
Comparison: Previous exam(s).

CLINICAL DATA: 42-year-old patient underwent MRI guided right
breast biopsy today.

EXAM:
DIAGNOSTIC RIGHT MAMMOGRAM POST MRI BIOPSY

[R ML synth-2D]
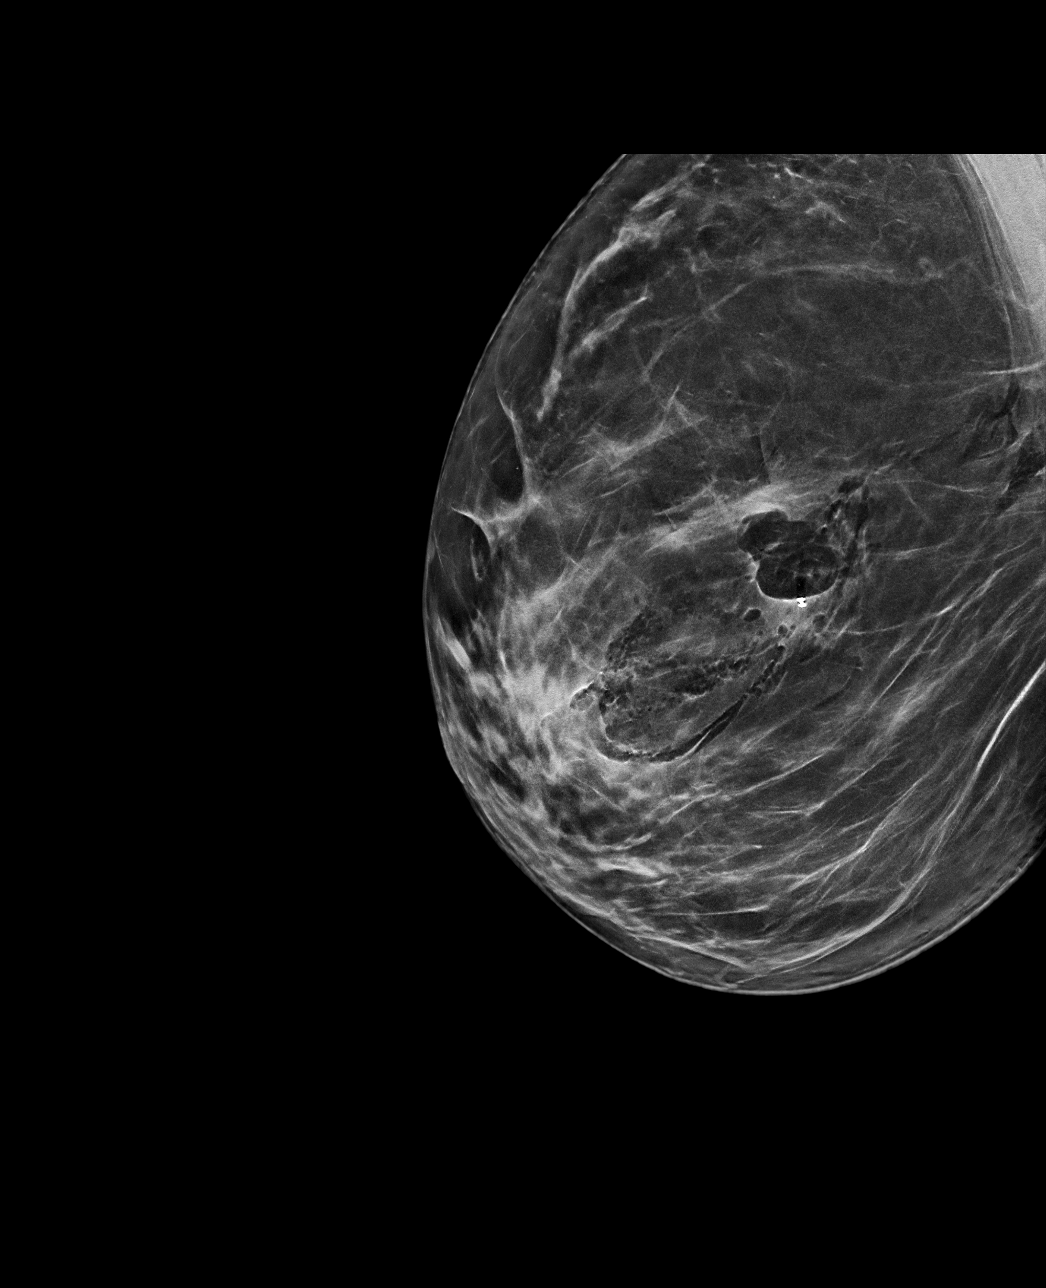

[R CC synth-2D]
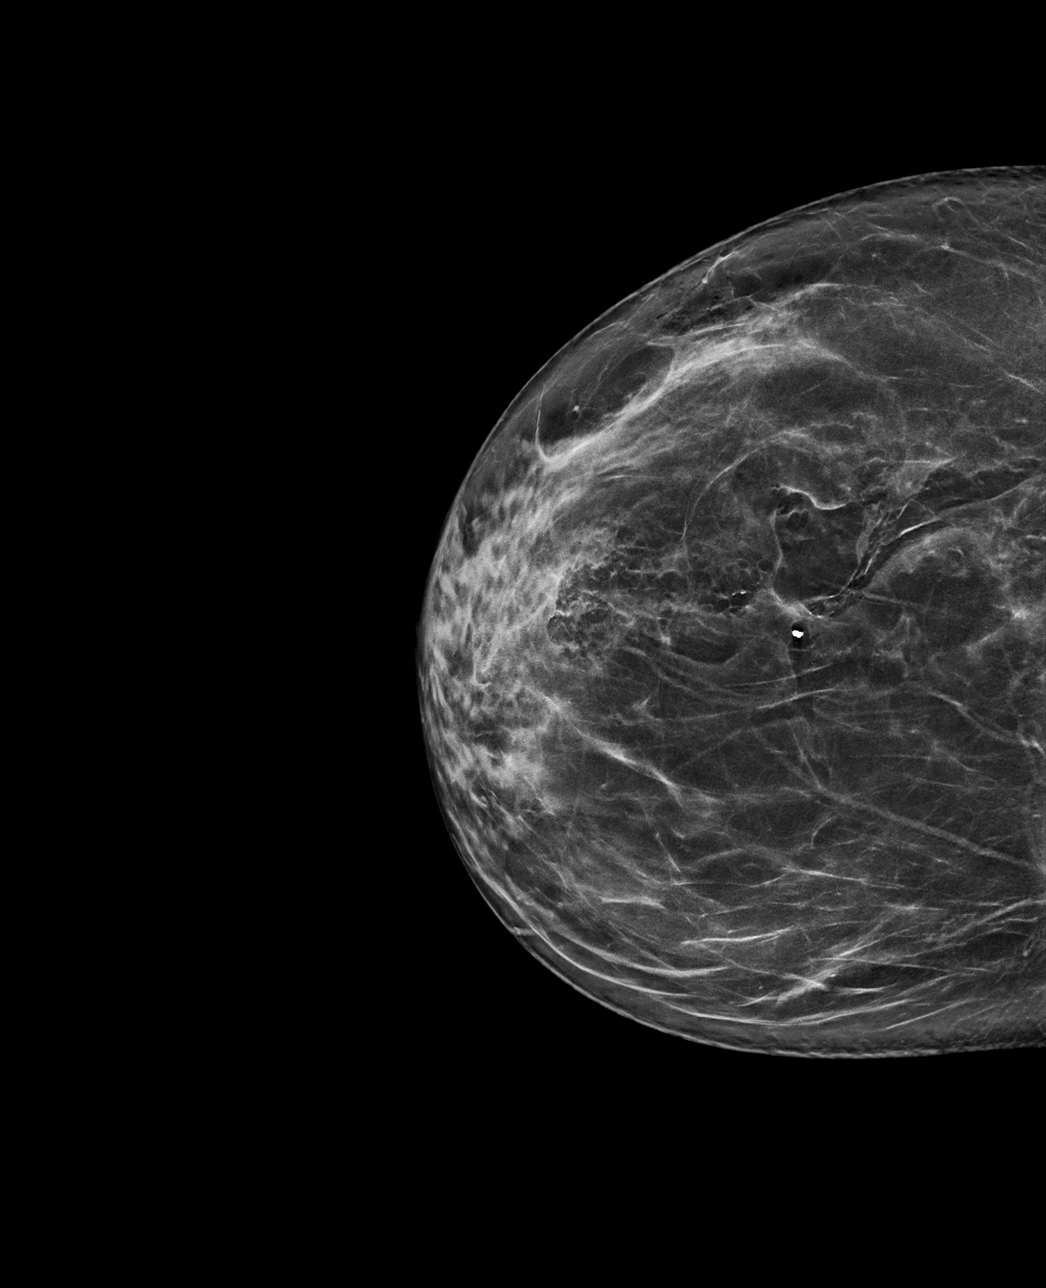

[R CC tomo · tomo slice 39/77.0]
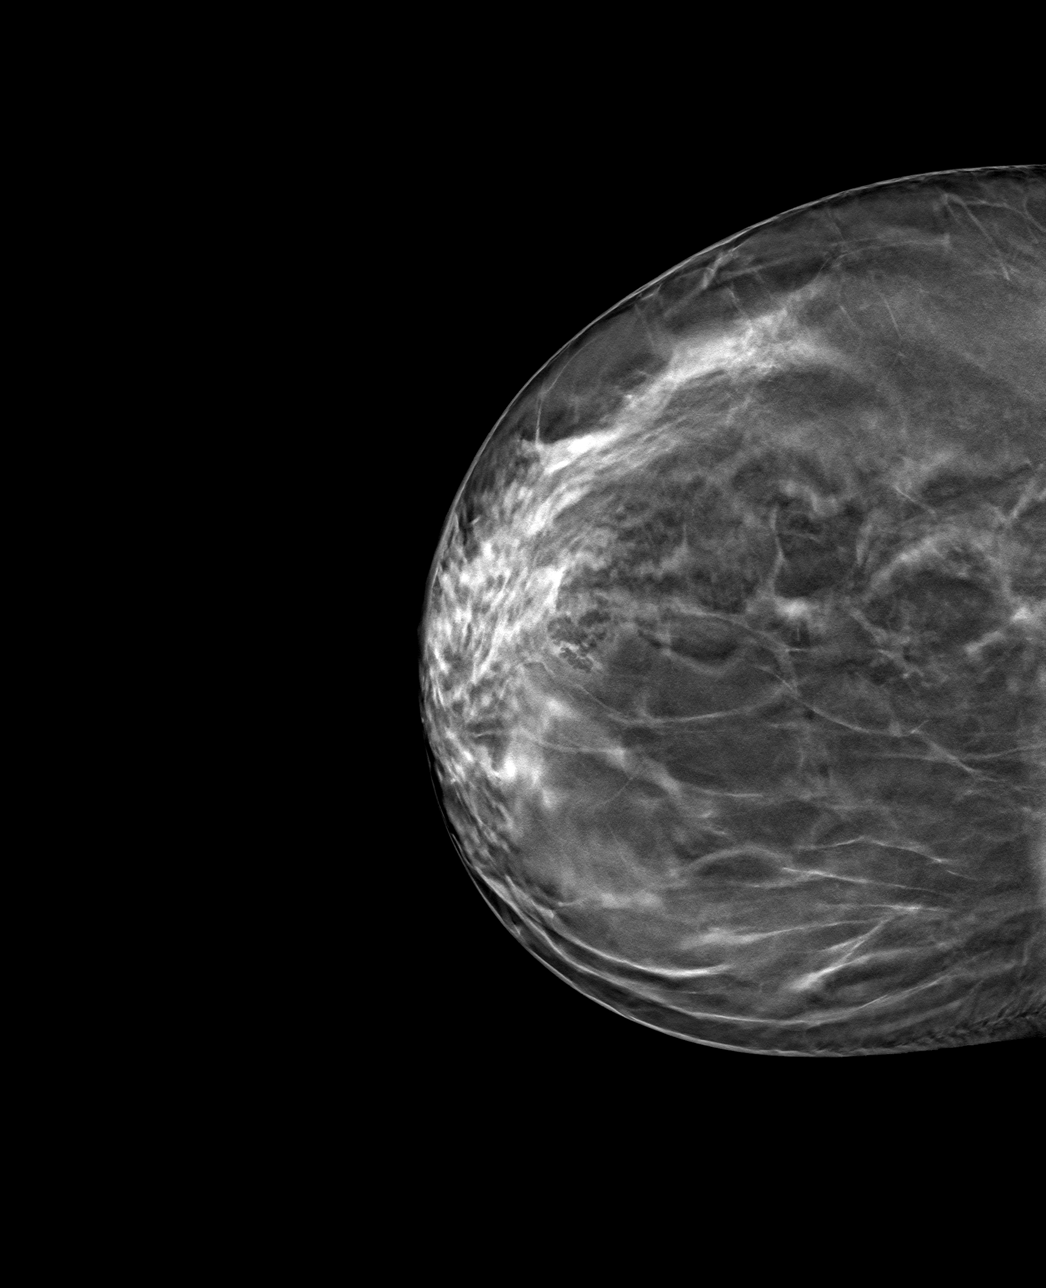

[R ML tomo · tomo slice 41/80.0]
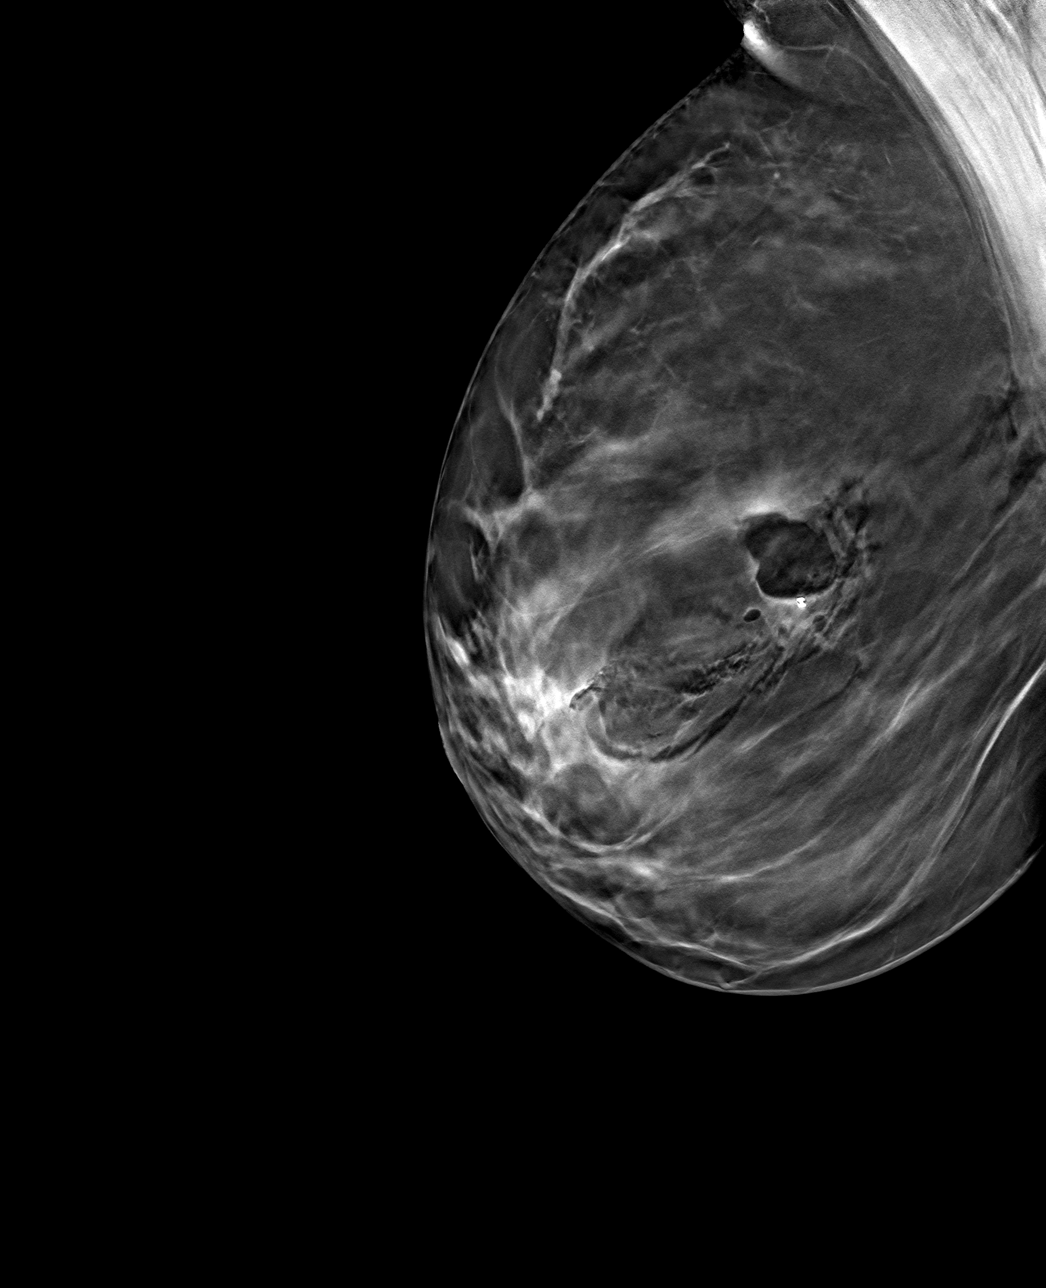

[4 of 12 positions shown; findings below may reference images not displayed]

FINDINGS: Mammographic images were obtained following MRI guided biopsy of an
approximately 9 mm enhancing elongate nodule in the central right
breast. A barbell shaped biopsy clip is in satisfactory position.
IMPRESSION: Satisfactory position of barbell shaped biopsy clip in the central
right breast.

Final Assessment: Post Procedure Mammograms for Marker Placement

## 2018-04-11 MED ORDER — GADOBUTROL 1 MMOL/ML IV SOLN
7.0000 mL | Freq: Once | INTRAVENOUS | Status: AC | PRN
  Administered 2018-04-11: 7 mL via INTRAVENOUS

## 2018-04-12 ENCOUNTER — Encounter: Payer: Self-pay | Admitting: *Deleted

## 2018-04-12 NOTE — Progress Notes (Signed)
Tried to call patient but no answer and no voicemail.  Benign biopsy results, but recommending surgical consult for the nipple discharge.  Will try to call her again later today.

## 2018-04-13 ENCOUNTER — Other Ambulatory Visit: Payer: Self-pay | Admitting: *Deleted

## 2018-04-13 ENCOUNTER — Encounter: Payer: Self-pay | Admitting: *Deleted

## 2018-04-13 DIAGNOSIS — N6452 Nipple discharge: Secondary | ICD-10-CM

## 2018-04-13 NOTE — Progress Notes (Signed)
Called patient to review benign biopsy results.  Informed of need for surgical consult for the nipple discharge.  Referral placed to [facility].  Patient is aware her consult could be delayed due to the Coronvirus.  She is understands this is not an emergency.

## 2018-04-18 ENCOUNTER — Other Ambulatory Visit: Payer: Self-pay

## 2018-04-18 ENCOUNTER — Ambulatory Visit (INDEPENDENT_AMBULATORY_CARE_PROVIDER_SITE_OTHER): Payer: Self-pay | Admitting: Surgery

## 2018-04-18 ENCOUNTER — Encounter: Payer: Self-pay | Admitting: Surgery

## 2018-04-18 VITALS — BP 128/86 | HR 70 | Temp 97.7°F | Resp 18 | Ht 69.0 in | Wt 192.0 lb

## 2018-04-18 DIAGNOSIS — N6452 Nipple discharge: Secondary | ICD-10-CM

## 2018-04-18 NOTE — Patient Instructions (Addendum)
Please see your appointment listed below.       Breast Self-Awareness Breast self-awareness means:  Knowing how your breasts look.  Knowing how your breasts feel.  Checking your breasts every month for changes.  Telling your doctor if you notice a change in your breasts. Breast self-awareness allows you to notice a breast problem early while it is still small. How to do a breast self-exam One way to learn what is normal for your breasts and to check for changes is to do a breast self-exam. To do a breast self-exam: Look for Changes  1. Take off all the clothes above your waist. 2. Stand in front of a mirror in a room with good lighting. 3. Put your hands on your hips. 4. Push your hands down. 5. Look at your breasts and nipples in the mirror to see if one breast or nipple looks different than the other. Check to see if: ? The shape of one breast is different. ? The size of one breast is different. ? There are wrinkles, dips, and bumps in one breast and not the other. 6. Look at each breast for changes in your skin, such as: ? Redness. ? Scaly areas. 7. Look for changes in your nipples, such as: ? Liquid around the nipples. ? Bleeding. ? Dimpling. ? Redness. ? A change in where the nipples are. Feel for Changes 1. Lie on your back on the floor. 2. Feel each breast. To do this, follow these steps: ? Pick a breast to feel. ? Put the arm closest to that breast above your head. ? Use your other arm to feel the nipple area of your breast. Feel the area with the pads of your three middle fingers by making small circles with your fingers. For the first circle, press lightly. For the second circle, press harder. For the third circle, press even harder. ? Keep making circles with your fingers at the light, harder, and even harder pressures as you move down your breast. Stop when you feel your ribs. ? Move your fingers a little toward the center of your body. ? Start making  circles with your fingers again, this time going up until you reach your collarbone. ? Keep making up and down circles until you reach your armpit. Remember to keep using the three pressures. ? Feel the other breast in the same way. 3. Sit or stand in the shower or tub. 4. With soapy water on your skin, feel each breast the same way you did in step 2, when you were lying on the floor. Write Down What You Find After doing the self-exam, write down:  What is normal for each breast.  Any changes you find in each breast.  When you last had your period.  How often should I check my breasts? Check your breasts every month. If you are breastfeeding, the best time to check them is after you feed your baby or after you use a breast pump. If you get periods, the best time to check your breasts is 5-7 days after your period is over. When should I see my doctor? See your doctor if you notice:  A change in shape or size of your breasts or nipples.  A change in the skin of your breast or nipples, such as red or scaly skin.  Unusual fluid coming from your nipples.  A lump or thick area that was not there before.  Pain in your breasts.  Anything that concerns you. This  information is not intended to replace advice given to you by your health care provider. Make sure you discuss any questions you have with your health care provider. Document Released: 06/10/2007 Document Revised: 05/30/2015 Document Reviewed: 11/11/2014 Elsevier Interactive Patient Education  2019 [employer]

## 2018-04-20 ENCOUNTER — Encounter: Payer: Self-pay | Admitting: Surgery

## 2018-04-20 NOTE — Progress Notes (Signed)
Patient ID: Cynthia Warren, female   DOB: 10/20/1975, 43 y.o.   MRN: [id number]  HPI Cynthia Warren is a 43 y.o. female seen for abnormal Right breast DC. Reports Dc since October, clear. Does reports some intermittent pain, dull, mild. No specific alleviating or aggravating factors. She has had extensive w/u to include Mammo, U//S and MRI. I have pers. Reviewed all the images. There is a breast asymmetry on the right side ? Mass, THis was biopsied using MRI guidance. Path still pending. Fam hx significant for grandmother w breast CA, grandfather w stomach Ca.Marland Kitchen LMP 3 years ago. 2 Pregnancies. No breast feeding.  She reports that over the last 3-4 weeks her DC has stopped.  HPI  History reviewed. No pertinent past medical history.  Past Surgical History:  Procedure Laterality Date  . c section times 2      Family History  Problem Relation Age of Onset  . Breast cancer Maternal Grandmother 82  . Hypertension Mother   . Lung cancer Maternal Grandfather   . Breast cancer Paternal Grandmother     Social History Social History   Tobacco Use  . Smoking status: Current Some Day Smoker    Packs/day: 0.25    Types: Cigarettes  . Smokeless tobacco: Never Used  Substance Use Topics  . Alcohol use: Yes    Comment: ocassionally  . Drug use: Not on file    No Known Allergies  No current outpatient medications on file.   No current facility-administered medications for this visit.      Review of Systems Full ROS  was asked and was negative except for the information on the HPI  Physical Exam Blood pressure 128/86, pulse 70, temperature 97.7 F (36.5 C), temperature source Temporal, resp. rate 18, height 5\' 9"  (1.753 m), weight 192 lb (87.1 kg), SpO2 98 %. CONSTITUTIONAL:NAD. EYES: Pupils are equal, round, and reactive to light, Sclera are non-icteric. EARS, NOSE, MOUTH AND THROAT: The oropharynx is clear. The oral mucosa is pink and moist. Hearing is intact to  voice. LYMPH NODES:  Lymph nodes in the neck are normal. RESPIRATORY:  Lungs are clear. There is normal respiratory effort, with equal breath sounds bilaterally, and without pathologic use of accessory muscles. CARDIOVASCULAR: Heart is regular without murmurs, gallops, or rubs. BREAST: no masses , normal skin, I was unable to reproduce any abnormal discharge. Bilateral axilla no evidence of adenopathy. GI: The abdomen is  soft, nontender, and nondistended. There are no palpable masses. There is no hepatosplenomegaly. There are normal bowel sounds in all quadrants. GU: Rectal deferred.   MUSCULOSKELETAL: Normal muscle strength and tone. No cyanosis or edema.   SKIN: Turgor is good and there are no pathologic skin lesions or ulcers. NEUROLOGIC: Motor and sensation is grossly normal. Cranial nerves are grossly intact. PSYCH:  Oriented to person, place and time. Affect is normal.  Data Reviewed I have personally reviewed the patient's imaging, laboratory findings and medical records.    Assessment/Plan 43 yo female with abnormal nipple DC on the right. S/p biopsy w pending results. No need for surgical intervention at this time. May f/u w virtual visit vs face to face depending on path results and pt preference. D/w the pot about the images studies in detail. If she continue to have Discharge despite images may benefit from central duct excision. Given the covid-19 epidemic this falls under purely elective procedure. We will continue to follow her. Extensive counseling provided.  Sterling Big, MD [facility] General Surgeon  04/20/2018, 12:26 PM

## 2018-05-02 ENCOUNTER — Other Ambulatory Visit: Payer: Self-pay

## 2018-05-02 ENCOUNTER — Telehealth (INDEPENDENT_AMBULATORY_CARE_PROVIDER_SITE_OTHER): Payer: Self-pay | Admitting: Surgery

## 2018-05-02 DIAGNOSIS — N6452 Nipple discharge: Secondary | ICD-10-CM

## 2018-05-02 NOTE — Progress Notes (Signed)
Telemedicine Surgical Follow Up  05/02/2018  Cynthia Warren is an 43 y.o. female.   No chief complaint on file.   HPI:   Pt f/u nipple d/c. Path d/w pt, benign breast tissue. Recommend mammo, u/s f/u and clinical f/u 6 months. She is asymptomatic and is healing from biopsy. No fevers or chills, no pain. Location of the patient:Home Time spent on call: Facetime Total Time spent in the encounter including counseling and coordination of care: 10 Location of the Provider:Office The patient had given consent for a telemedicine visit and they understands the limitations associated with this including but not limited to privacy, cyber security and technology issues. Persons participating in the visit: pt    No past medical history on file.  Past Surgical History:  Procedure Laterality Date  . c section times 2      Family History  Problem Relation Age of Onset  . Breast cancer Maternal Grandmother 53  . Hypertension Mother   . Lung cancer Maternal Grandfather   . Breast cancer Paternal Grandmother     Social History:  reports that she has been smoking cigarettes. She has been smoking about 0.25 packs per day. She has never used smokeless tobacco. She reports current alcohol use. No history on file for drug.  Allergies: No Known Allergies  Medications reviewed.    ROS Full ROS performed and is otherwise negative other than what is stated in HPI   Assessment/Plan: Resolved nipple DC F/U 6 months w mammo and U/S The pt was provided an opportunity to ask questions and all were answered. The pt was advised to call back or seek in-person evaluation if the symptoms worsen or if the condition fails to improve as anticipated. Greater than 50% of the 10 minutes  visit was spent in counseling/coordination of care   Sterling Big, MD [facility] General Surgeon

## 2018-06-15 ENCOUNTER — Other Ambulatory Visit: Payer: Self-pay | Admitting: *Deleted

## 2018-06-15 ENCOUNTER — Encounter: Payer: Self-pay | Admitting: *Deleted

## 2018-06-15 DIAGNOSIS — N63 Unspecified lump in unspecified breast: Secondary | ICD-10-CM

## 2018-06-15 NOTE — Progress Notes (Signed)
Per recommendation per radiology the patient will need a 6 month follow up MRI if no surgical procedure is completed.  Dr. Dahlia Byes recommended a 6 month follow up.  Orders are placed for a 6 month follow up MRI and message sent to the breast center to schedule the patient.  Will follow up as indicated per [facility] protocol.

## 2018-06-21 ENCOUNTER — Encounter: Payer: Self-pay | Admitting: *Deleted

## 2018-06-21 NOTE — Progress Notes (Signed)
Letter mailed with next MRI appointment on 10/12/18 @ 8:00.  [facility] to [place].

## 2018-07-20 ENCOUNTER — Other Ambulatory Visit: Payer: Self-pay

## 2018-07-20 DIAGNOSIS — Z20822 Contact with and (suspected) exposure to covid-19: Secondary | ICD-10-CM

## 2018-07-24 LAB — NOVEL CORONAVIRUS, NAA: SARS-CoV-2, NAA: NOT DETECTED

## 2018-10-12 ENCOUNTER — Ambulatory Visit
Admission: RE | Admit: 2018-10-12 | Discharge: 2018-10-12 | Disposition: A | Discharge status: Home / Self Care | Payer: Self-pay | Source: Ambulatory Visit | Attending: Oncology | Admitting: Oncology

## 2018-10-12 ENCOUNTER — Other Ambulatory Visit: Payer: Self-pay

## 2018-10-12 DIAGNOSIS — N63 Unspecified lump in unspecified breast: Secondary | ICD-10-CM

## 2018-10-12 IMAGING — MR MR BREAST BILAT WO/W CM
2 of 9 series · 6 of 48 positions shown · IV contrast (8ml Gadavist)
Comparison: Previous exam(s).

CLINICAL DATA: 43-year-old female presenting for six-month
follow-up status post benign right breast MRI guided biopsy in [DATE]. The patient initially presented with clear right nipple
discharge which has since ceased. No new symptoms.

LABS:  None obtained density today.
EXAM:
BILATERAL BREAST MRI WITH AND WITHOUT CONTRAST
TECHNIQUE: Multiplanar, multisequence MR images of both breasts were obtained
prior to and following the intravenous administration of 8 ml of
Gadavist.

[Series 2: T1 · axial · B · 1.5mm · 1.02mm/px · z∈[-92,+75]mm · 5 of 112 slices shown]
[im 1/112]
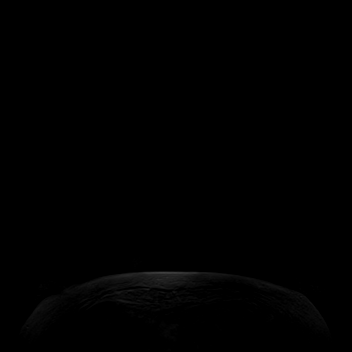
[im 28/112]
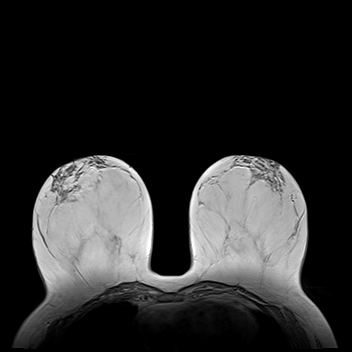
[im 56/112]
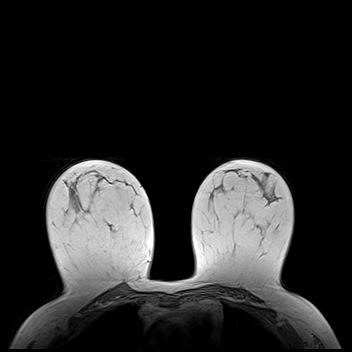
[im 84/112]
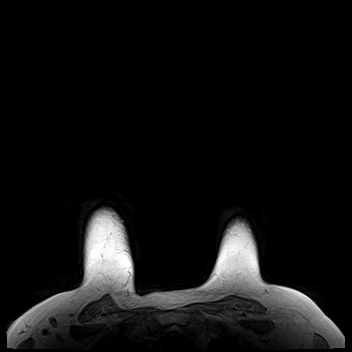
[im 112/112]
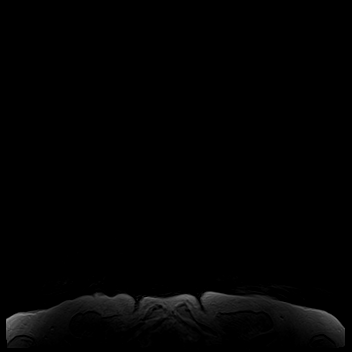

[Series 3: T2 · axial · B · 3.0mm · 1.02mm/px · 1 of 37 slices shown]
[im 1/37]
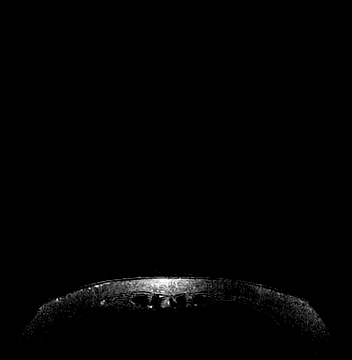

[6 of 48 positions shown; findings below may reference images not displayed]

Three-dimensional MR images were rendered by post-processing of the
original MR data on an independent workstation. The
three-dimensional MR images were interpreted, and findings are
reported in the following complete MRI report for this study. Three
dimensional images were evaluated at the independent [employer]
workstation
FINDINGS: Breast composition: a. Almost entirely fat.

Background parenchymal enhancement: Mild.

Right breast: Susceptibility artifact from post biopsy tissue marker
is demonstrated in the central right breast at middle depth. This is
at the site of the previously biopsied enhancing mass, which is no
longer seen. No mass or abnormal enhancement.

Left breast: No mass or abnormal enhancement.

Lymph nodes: No abnormal appearing lymph nodes.

Ancillary findings:  None.
IMPRESSION: 1. No MRI evidence of malignancy in either breast.
2. Interval post biopsy changes of the right breast, with interval
resolution of the previously seen enhancing mass.

RECOMMENDATION:
Annual screening mammography, due in [DATE].

BI-RADS CATEGORY  2: Benign.

## 2018-10-12 MED ORDER — GADOBUTROL 1 MMOL/ML IV SOLN
8.0000 mL | Freq: Once | INTRAVENOUS | Status: AC | PRN
  Administered 2018-10-12: 8 mL via INTRAVENOUS

## 2018-10-17 ENCOUNTER — Ambulatory Visit (INDEPENDENT_AMBULATORY_CARE_PROVIDER_SITE_OTHER): Payer: Self-pay | Admitting: Surgery

## 2018-10-17 ENCOUNTER — Other Ambulatory Visit: Payer: Self-pay

## 2018-10-17 ENCOUNTER — Encounter: Payer: Self-pay | Admitting: Surgery

## 2018-10-17 VITALS — BP 145/93 | HR 74 | Temp 97.5°F | Ht 69.0 in | Wt 188.0 lb

## 2018-10-17 DIAGNOSIS — N6452 Nipple discharge: Secondary | ICD-10-CM

## 2018-10-17 NOTE — Patient Instructions (Addendum)
Please follow up with your yearly mammogram. Follow-up with our office as needed. Please call and ask to speak with a nurse if you develop questions or concerns.

## 2018-10-19 NOTE — Progress Notes (Signed)
Outpatient Surgical Follow Up  10/19/2018  Cynthia Warren is an 43 y.o. female.   Chief Complaint  Patient presents with  . Follow-up    f/u- March Nipple discharge     HPI: Cynthia Warren a 43 year old female with history of nipple discharge.  She did did have an MRI guided biopsy showing benign tissue.  Her nipple discharge has completely disappeared.  We did obtain another MRI that I have personally reviewed showing resolution of previous enhancing mass.  Changes consistent with biopsy.  She is doing well and has no concerns.  No fevers no chills  History reviewed. No pertinent past medical history.  Past Surgical History:  Procedure Laterality Date  . c section times 2      Family History  Problem Relation Age of Onset  . Breast cancer Maternal Grandmother 60  . Hypertension Mother   . Lung cancer Maternal Grandfather   . Breast cancer Paternal Grandmother     Social History:  reports that she has been smoking cigarettes. She has been smoking about 0.25 packs per day. She has never used smokeless tobacco. She reports current alcohol use. No history on file for drug.  Allergies: No Known Allergies  Medications reviewed.    ROS Full ROS performed and is otherwise negative other than what is stated in HPI   BP (!) 145/93   Pulse 74   Temp (!) 97.5 F (36.4 C) (Temporal)   Ht 5\' 9"  (1.753 m)   Wt 188 lb (85.3 kg)   SpO2 97%   BMI 27.76 kg/m   Physical Exam Vitals signs and nursing note reviewed. Exam conducted with a chaperone present.  Constitutional:      General: She is not in acute distress.    Appearance: She is normal weight.  Eyes:     General: No scleral icterus.       Right eye: No discharge.        Left eye: No discharge.  Neck:     Musculoskeletal: Normal range of motion and neck supple. No neck rigidity or muscular tenderness.  Cardiovascular:     Rate and Rhythm: Normal rate and regular rhythm.     Heart sounds: No murmur.  Pulmonary:     Effort: Pulmonary effort is normal. No respiratory distress.     Comments: BREAST: There is no evidence of any nipple discharge or nipple abnormalities.  No evidence of any palpable masses and no evidence of lymphadenopathy Lymphadenopathy:     Cervical: No cervical adenopathy.  Skin:    General: Skin is warm and dry.     Capillary Refill: Capillary refill takes less than 2 seconds.  Neurological:     General: No focal deficit present.     Mental Status: She is alert and oriented to person, place, and time.  Psychiatric:        Mood and Affect: Mood normal.        Behavior: Behavior normal.        Thought Content: Thought content normal.        Judgment: Judgment normal.        Assessment/Plan: 43 year old female with resolved nipple discharge.  MRI without evidence of any concerning disease.  No evidence of any pathological lesions on physical exam.  I recommend interval mammogram next year March 2021 with a physical exam. Discussed with the patient in detail about my thought process and imaging findings Greater than 50% of the 25 minutes  visit was spent in  counseling/coordination of care   Caroleen Hamman, MD [facility] General Surgeon

## 2019-08-15 ENCOUNTER — Other Ambulatory Visit: Payer: Self-pay

## 2019-08-15 ENCOUNTER — Ambulatory Visit
Admission: RE | Admit: 2019-08-15 | Discharge: 2019-08-15 | Disposition: A | Discharge status: Home / Self Care | Payer: No Typology Code available for payment source | Source: Ambulatory Visit | Attending: Emergency Medicine | Admitting: Emergency Medicine

## 2019-08-15 VITALS — BP 135/95 | HR 71 | Temp 99.1°F | Resp 18

## 2019-08-15 DIAGNOSIS — N23 Unspecified renal colic: Secondary | ICD-10-CM

## 2019-08-15 LAB — POCT URINALYSIS DIP (MANUAL ENTRY)
Bilirubin, UA: NEGATIVE
Glucose, UA: NEGATIVE mg/dL
Ketones, POC UA: NEGATIVE mg/dL
Leukocytes, UA: NEGATIVE
Nitrite, UA: NEGATIVE
Protein Ur, POC: NEGATIVE mg/dL
Spec Grav, UA: 1.02 (ref 1.010–1.025)
Urobilinogen, UA: 0.2 E.U./dL
pH, UA: 5.5 (ref 5.0–8.0)

## 2019-08-15 MED ORDER — KETOROLAC TROMETHAMINE 10 MG PO TABS: 10.0000 mg | ORAL_TABLET | Freq: Four times a day (QID) | ORAL | 0 refills | Status: DC | PRN

## 2019-08-15 MED ORDER — ONDANSETRON 4 MG PO TBDP
4.0000 mg | ORAL_TABLET | Freq: Three times a day (TID) | ORAL | 0 refills | Status: DC | PRN
Start: 2019-08-15 — End: 2019-10-16

## 2019-08-15 NOTE — Discharge Instructions (Signed)
Drink water! Take toradol & zofran as needed. Follow up with urology in 2 weeks. Go to ER for worsening pain, vomiting, fever, cannot urinate.

## 2019-08-15 NOTE — ED Triage Notes (Signed)
Pt present abdominal and flank pain,symptoms started a week ago. Pt is going to the bathroom more frequently then normal and urine was cloudy

## 2019-08-15 NOTE — ED Provider Notes (Signed)
[name]    CSN: [id number] Arrival date & time: 08/15/19  1326      History   Chief Complaint Chief Complaint  Patient presents with  . Abdominal Pain  . Flank Pain    HPI Cynthia Warren is a 44 y.o. female presenting for right flank pain that radiates to right side/abdomen.  No nausea, vomiting, chest pain, difficulty breathing, fever.  Does endorse urinary frequency without urgency, hematuria, burning.  No vaginal pain, pelvic pain, discharge.  Denies h/o renal stone.   History reviewed. No pertinent past medical history.  There are no problems to display for this patient.   Past Surgical History:  Procedure Laterality Date  . c section times 2      OB History   No obstetric history on file.      Home Medications    Prior to Admission medications   Medication Sig Start Date End Date Taking? Authorizing Provider  ketorolac (TORADOL) 10 MG tablet Take 1 tablet (10 mg total) by mouth every 6 (six) hours as needed. 08/15/19   Hall-Potvin, [place], PA-C  ondansetron (ZOFRAN ODT) 4 MG disintegrating tablet Take 1 tablet (4 mg total) by mouth every 8 (eight) hours as needed for nausea or vomiting. 08/15/19   Hall-Potvin, [place], PA-C    Family History Family History  Problem Relation Age of Onset  . Breast cancer Maternal Grandmother 20  . Hypertension Mother   . Lung cancer Maternal Grandfather   . Breast cancer Paternal Grandmother     Social History Social History   Tobacco Use  . Smoking status: Current Some Day Smoker    Packs/day: 0.25    Types: Cigarettes  . Smokeless tobacco: Never Used  Vaping Use  . Vaping Use: Never used  Substance Use Topics  . Alcohol use: Yes    Comment: ocassionally  . Drug use: Not on file     Allergies   Patient has no known allergies.   Review of Systems As per HPI   Physical Exam Triage Vital Signs ED Triage Vitals  Enc Vitals Group     BP 08/15/19 1348 (!) 135/95     Pulse Rate  08/15/19 1348 71     Resp 08/15/19 1348 18     Temp 08/15/19 1348 99.1 F (37.3 C)     Temp Source 08/15/19 1348 Oral     SpO2 08/15/19 1348 98 %     Weight --      Height --      Head Circumference --      Peak Flow --      Pain Score 08/15/19 1349 8     Pain Loc --      Pain Edu? --      Excl. in GC? --    No data found.  Updated Vital Signs BP (!) 135/95 (BP Location: Left Arm)   Pulse 71   Temp 99.1 F (37.3 C) (Oral)   Resp 18   SpO2 98%   Visual Acuity Right Eye Distance:   Left Eye Distance:   Bilateral Distance:    Right Eye Near:   Left Eye Near:    Bilateral Near:     Physical Exam Constitutional:      General: She is not in acute distress. HENT:     Head: Normocephalic and atraumatic.  Eyes:     General: No scleral icterus.    Pupils: Pupils are equal, round, and reactive to light.  Cardiovascular:  Rate and Rhythm: Normal rate.  Pulmonary:     Effort: Pulmonary effort is normal.  Abdominal:     General: Bowel sounds are normal.     Palpations: Abdomen is soft.     Tenderness: There is no abdominal tenderness. There is right CVA tenderness. There is no left CVA tenderness or guarding.  Skin:    Coloration: Skin is not jaundiced or pale.  Neurological:     Mental Status: She is alert and oriented to person, place, and time.      UC Treatments / Results  Labs (all labs ordered are listed, but only abnormal results are displayed) Labs Reviewed  POCT URINALYSIS DIP (MANUAL ENTRY) - Abnormal; Notable for the following components:      Result Value   Blood, UA trace-intact (*)    All other components within normal limits    EKG   Radiology No results found.  Procedures Procedures (including critical care time)  Medications Ordered in UC Medications - No data to display  Initial Impression / Assessment and Plan / UC Course  I have reviewed the triage vital signs and the nursing notes.  Pertinent labs & imaging results that were  available during my care of the patient were reviewed by me and considered in my medical decision making (see chart for details).     H&P consistent with right renal colic.  Urine dipstick with trace-intact blood.  Will treat supportively as outlined below.  Provided strainer, urologic contact information for follow-up if needed.  Return precautions discussed, pt verbalized understanding and is agreeable to plan. Final Clinical Impressions(s) / UC Diagnoses   Final diagnoses:  Renal colic on right side     Discharge Instructions     Drink water! Take toradol & zofran as needed. Follow up with urology in 2 weeks. Go to ER for worsening pain, vomiting, fever, cannot urinate.    ED Prescriptions    Medication Sig Dispense Auth. Provider   ketorolac (TORADOL) 10 MG tablet Take 1 tablet (10 mg total) by mouth every 6 (six) hours as needed. 20 tablet Hall-Potvin, [place], PA-C   ondansetron (ZOFRAN ODT) 4 MG disintegrating tablet Take 1 tablet (4 mg total) by mouth every 8 (eight) hours as needed for nausea or vomiting. 21 tablet Hall-Potvin, [place], PA-C     PDMP not reviewed this encounter.   Hall-Potvin, [place], [place] 08/15/19 1437

## 2019-09-13 ENCOUNTER — Other Ambulatory Visit: Payer: Self-pay

## 2019-09-13 DIAGNOSIS — Z1231 Encounter for screening mammogram for malignant neoplasm of breast: Secondary | ICD-10-CM

## 2019-10-11 ENCOUNTER — Other Ambulatory Visit: Payer: Self-pay

## 2019-10-11 ENCOUNTER — Ambulatory Visit: Payer: No Typology Code available for payment source

## 2019-10-16 ENCOUNTER — Ambulatory Visit
Admission: RE | Admit: 2019-10-16 | Discharge: 2019-10-16 | Disposition: A | Discharge status: Home / Self Care | Payer: Self-pay | Source: Ambulatory Visit | Attending: Emergency Medicine | Admitting: Emergency Medicine

## 2019-10-16 ENCOUNTER — Other Ambulatory Visit: Payer: Self-pay

## 2019-10-16 ENCOUNTER — Ambulatory Visit: Payer: Self-pay | Admitting: Surgery

## 2019-10-16 VITALS — BP 143/109 | HR 79 | Temp 98.5°F | Resp 18

## 2019-10-16 DIAGNOSIS — J069 Acute upper respiratory infection, unspecified: Secondary | ICD-10-CM

## 2019-10-16 MED ORDER — IBUPROFEN 800 MG PO TABS
800.0000 mg | ORAL_TABLET | Freq: Three times a day (TID) | ORAL | 0 refills | Status: DC
Start: 2019-10-16 — End: 2021-08-19

## 2019-10-16 MED ORDER — FLUTICASONE PROPIONATE 50 MCG/ACT NA SUSP: 1.0000 | Freq: Every day | NASAL | 2 refills | Status: DC

## 2019-10-16 MED ORDER — BENZONATATE 200 MG PO CAPS: 200.0000 mg | ORAL_CAPSULE | Freq: Three times a day (TID) | ORAL | 0 refills | Status: AC | PRN

## 2019-10-16 NOTE — ED Triage Notes (Signed)
Pt c/o productive cough with green sputum and nasal congestion x5 days. Pt c/o pain to rt side and under rt breast prior to coughing but now worse.

## 2019-10-16 NOTE — ED Provider Notes (Signed)
[name]    CSN: [id number] Arrival date & time: 10/16/19  1743      History   Chief Complaint Chief Complaint  Patient presents with   appt 6-cough    HPI Cynthia Warren is a 44 y.o. female presenting today for evaluation of URI symptoms and cough.  Patient reports over the past 5 days she has had a cough and congestion.  Congestion has been in sinuses as well as in chest.  Denies any sore throat.  Denies fevers.  Reports granddaughter with RSV.  Denies known Covid exposures.  Denies GI symptoms.  Also reporting pain under her right breast.  HPI  History reviewed. No pertinent past medical history.  There are no problems to display for this patient.   Past Surgical History:  Procedure Laterality Date   c section times 2      OB History   No obstetric history on file.      Home Medications    Prior to Admission medications   Medication Sig Start Date End Date Taking? Authorizing Provider  benzonatate (TESSALON) 200 MG capsule Take 1 capsule (200 mg total) by mouth 3 (three) times daily as needed for up to 7 days for cough. 10/16/19 10/23/19  Randie Tallarico C, PA-C  fluticasone (FLONASE) 50 MCG/ACT nasal spray Place 1-2 sprays into both nostrils daily. 10/16/19   Gailyn Crook C, PA-C  ibuprofen (ADVIL) 800 MG tablet Take 1 tablet (800 mg total) by mouth 3 (three) times daily. 10/16/19   Alexee Delsanto, Junius Creamer, PA-C    Family History Family History  Problem Relation Age of Onset   Breast cancer Maternal Grandmother 6   Hypertension Mother    Lung cancer Maternal Grandfather    Breast cancer Paternal Grandmother     Social History Social History   Tobacco Use   Smoking status: Current Some Day Smoker    Packs/day: 0.25    Types: Cigarettes   Smokeless tobacco: Never Used  Vaping Use   Vaping Use: Never used  Substance Use Topics   Alcohol use: Yes    Comment: ocassionally   Drug use: Not Currently     Allergies     Patient has no known allergies.   Review of Systems Review of Systems  Constitutional: Positive for fatigue. Negative for activity change, appetite change, chills and fever.  HENT: Positive for congestion and rhinorrhea. Negative for ear pain, sinus pressure, sore throat and trouble swallowing.   Eyes: Negative for discharge and redness.  Respiratory: Positive for cough. Negative for chest tightness and shortness of breath.   Cardiovascular: Positive for chest pain.  Gastrointestinal: Negative for abdominal pain, diarrhea, nausea and vomiting.  Musculoskeletal: Positive for myalgias.  Skin: Negative for rash.  Neurological: Negative for dizziness, light-headedness and headaches.     Physical Exam Triage Vital Signs ED Triage Vitals  Enc Vitals Group     BP      Pulse      Resp      Temp      Temp src      SpO2      Weight      Height      Head Circumference      Peak Flow      Pain Score      Pain Loc      Pain Edu?      Excl. in GC?    No data found.  Updated Vital Signs BP (!) 143/109 (BP  Location: Left Arm)    Pulse 79    Temp 98.5 F (36.9 C) (Oral)    Resp 18    SpO2 98%   Visual Acuity Right Eye Distance:   Left Eye Distance:   Bilateral Distance:    Right Eye Near:   Left Eye Near:    Bilateral Near:     Physical Exam Vitals and nursing note reviewed.  Constitutional:      Appearance: She is well-developed.     Comments: No acute distress  HENT:     Head: Normocephalic and atraumatic.     Ears:     Comments: Bilateral ears without tenderness to palpation of external auricle, tragus and mastoid, EAC's without erythema or swelling, TM's with good bony landmarks and cone of light. Non erythematous.     Nose: Nose normal.     Mouth/Throat:     Comments: Oral mucosa pink and moist, no tonsillar enlargement or exudate. Posterior pharynx patent and nonerythematous, no uvula deviation or swelling. Normal phonation. Eyes:     Conjunctiva/sclera:  Conjunctivae normal.  Cardiovascular:     Rate and Rhythm: Normal rate.  Pulmonary:     Effort: Pulmonary effort is normal. No respiratory distress.     Comments: Breathing comfortably at rest, CTABL, no wheezing, rales or other adventitious sounds auscultated  Tender to palpation underneath right breast into right lower rib cage Abdominal:     General: There is no distension.  Musculoskeletal:        General: Normal range of motion.     Cervical back: Neck supple.  Skin:    General: Skin is warm and dry.  Neurological:     Mental Status: She is alert and oriented to person, place, and time.      UC Treatments / Results  Labs (all labs ordered are listed, but only abnormal results are displayed) Labs Reviewed  NOVEL CORONAVIRUS, NAA    EKG   Radiology No results found.  Procedures Procedures (including critical care time)  Medications Ordered in UC Medications - No data to display  Initial Impression / Assessment and Plan / UC Course  I have reviewed the triage vital signs and the nursing notes.  Pertinent labs & imaging results that were available during my care of the patient were reviewed by me and considered in my medical decision making (see chart for details).     Covid test pending, suspect likely viral etiology, lungs clear in vital signs stable currently.  Exam reassuring.  Recommending continued symptomatic and supportive care rest and fluids.  Discussed strict return precautions. Patient verbalized understanding and is agreeable with plan.  Final Clinical Impressions(s) / UC Diagnoses   Final diagnoses:  Viral URI with cough     Discharge Instructions     Covid test pending, monitor my chart for results Tessalon/benzonatate every 8 hours as needed for cough May continue Mucinex for cough/congestion Flonase nasal spray 1 to 2 spray in each nostril daily Ibuprofen and Tylenol for side pain Rest and fluids Follow-up if not improving or  worsening    ED Prescriptions    Medication Sig Dispense Auth. Provider   fluticasone (FLONASE) 50 MCG/ACT nasal spray Place 1-2 sprays into both nostrils daily. 16 g Kayode Petion C, PA-C   benzonatate (TESSALON) 200 MG capsule Take 1 capsule (200 mg total) by mouth 3 (three) times daily as needed for up to 7 days for cough. 28 capsule Blaize Nipper C, PA-C   ibuprofen (ADVIL) 800  MG tablet Take 1 tablet (800 mg total) by mouth 3 (three) times daily. 21 tablet Kerry Odonohue, [place] C, PA-C     PDMP not reviewed this encounter.   Lew Dawes, [place] 10/16/19 1850

## 2019-10-16 NOTE — Discharge Instructions (Signed)
Covid test pending, monitor my chart for results Tessalon/benzonatate every 8 hours as needed for cough May continue Mucinex for cough/congestion Flonase nasal spray 1 to 2 spray in each nostril daily Ibuprofen and Tylenol for side pain Rest and fluids Follow-up if not improving or worsening

## 2019-10-19 LAB — NOVEL CORONAVIRUS, NAA: SARS-CoV-2, NAA: NOT DETECTED

## 2019-12-21 ENCOUNTER — Other Ambulatory Visit: Payer: Self-pay

## 2019-12-21 ENCOUNTER — Ambulatory Visit
Admission: EM | Admit: 2019-12-21 | Discharge: 2019-12-21 | Disposition: A | Discharge status: Home / Self Care | Payer: No Typology Code available for payment source | Attending: Emergency Medicine | Admitting: Emergency Medicine

## 2019-12-21 DIAGNOSIS — R1030 Lower abdominal pain, unspecified: Secondary | ICD-10-CM | POA: Insufficient documentation

## 2019-12-21 DIAGNOSIS — R300 Dysuria: Secondary | ICD-10-CM | POA: Insufficient documentation

## 2019-12-21 LAB — POCT URINE PREGNANCY: Preg Test, Ur: NEGATIVE

## 2019-12-21 LAB — POCT URINALYSIS DIP (MANUAL ENTRY)
Bilirubin, UA: NEGATIVE
Glucose, UA: NEGATIVE mg/dL
Ketones, POC UA: NEGATIVE mg/dL
Leukocytes, UA: NEGATIVE
Nitrite, UA: NEGATIVE
Protein Ur, POC: NEGATIVE mg/dL
Spec Grav, UA: >=1.03 — AB (ref 1.010–1.025)
Urobilinogen, UA: 0.2 E.U./dL
pH, UA: 6 (ref 5.0–8.0)

## 2019-12-21 MED ORDER — NAPROXEN 500 MG PO TABS
500.0000 mg | ORAL_TABLET | Freq: Two times a day (BID) | ORAL | 0 refills | Status: DC
Start: 2019-12-21 — End: 2021-08-19

## 2019-12-21 MED ORDER — PHENAZOPYRIDINE HCL 200 MG PO TABS
200.0000 mg | ORAL_TABLET | Freq: Three times a day (TID) | ORAL | 0 refills | Status: DC
Start: 2019-12-21 — End: 2021-08-19

## 2019-12-21 NOTE — ED Provider Notes (Signed)
[name]    CSN: [id number] Arrival date & time: 12/21/19  1559      History   Chief Complaint Chief Complaint  Patient presents with  . Abdominal Pain    Uti symptoms x 1 week    HPI Cynthia Warren is a 44 y.o. female presenting today for evaluation of possible UTI.  Reports over the past couple days she has had dysuria at end of urination stream as well as some lower abdominal pain and cramping.  She has reported some slight vaginal itching, but denies any discharge or irritation.  Denies any new partners or concerns for STDs.  Denies any significant history of UTI yeast or BV.  HPI  History reviewed. No pertinent past medical history.  There are no problems to display for this patient.   Past Surgical History:  Procedure Laterality Date  . c section times 2      OB History   No obstetric history on file.      Home Medications    Prior to Admission medications   Medication Sig Start Date End Date Taking? Authorizing Provider  fluticasone (FLONASE) 50 MCG/ACT nasal spray Place 1-2 sprays into both nostrils daily. 10/16/19   Lua Feng C, PA-C  ibuprofen (ADVIL) 800 MG tablet Take 1 tablet (800 mg total) by mouth 3 (three) times daily. 10/16/19   Bryden Darden C, PA-C  naproxen (NAPROSYN) 500 MG tablet Take 1 tablet (500 mg total) by mouth 2 (two) times daily. 12/21/19   Jazmarie Biever C, PA-C  phenazopyridine (PYRIDIUM) 200 MG tablet Take 1 tablet (200 mg total) by mouth 3 (three) times daily. 12/21/19   Marrie Chandra, Junius Creamer, PA-C    Family History Family History  Problem Relation Age of Onset  . Breast cancer Maternal Grandmother 29  . Hypertension Mother   . Lung cancer Maternal Grandfather   . Breast cancer Paternal Grandmother     Social History Social History   Tobacco Use  . Smoking status: Current Some Day Smoker    Packs/day: 0.25    Types: Cigarettes  . Smokeless tobacco: Never Used  Vaping Use  . Vaping Use: Never  used  Substance Use Topics  . Alcohol use: Yes    Comment: ocassionally  . Drug use: Not Currently     Allergies   Patient has no known allergies.   Review of Systems Review of Systems  Constitutional: Negative for fever.  Respiratory: Negative for shortness of breath.   Cardiovascular: Negative for chest pain.  Gastrointestinal: Positive for abdominal pain. Negative for diarrhea, nausea and vomiting.  Genitourinary: Positive for dysuria and frequency. Negative for flank pain, genital sores, hematuria, menstrual problem, vaginal bleeding, vaginal discharge and vaginal pain.  Musculoskeletal: Negative for back pain.  Skin: Negative for rash.  Neurological: Negative for dizziness, light-headedness and headaches.     Physical Exam Triage Vital Signs ED Triage Vitals  Enc Vitals Group     BP      Pulse      Resp      Temp      Temp src      SpO2      Weight      Height      Head Circumference      Peak Flow      Pain Score      Pain Loc      Pain Edu?      Excl. in GC?    No data found.  Updated Vital Signs BP 117/84 (BP Location: Left Arm)   Pulse 80   Temp 98 F (36.7 C) (Oral)   Resp 16   SpO2 98%   Visual Acuity Right Eye Distance:   Left Eye Distance:   Bilateral Distance:    Right Eye Near:   Left Eye Near:    Bilateral Near:     Physical Exam Vitals and nursing note reviewed.  Constitutional:      Appearance: She is well-developed and well-nourished.     Comments: No acute distress  HENT:     Head: Normocephalic and atraumatic.     Nose: Nose normal.  Eyes:     Conjunctiva/sclera: Conjunctivae normal.  Cardiovascular:     Rate and Rhythm: Normal rate.  Pulmonary:     Effort: Pulmonary effort is normal. No respiratory distress.  Abdominal:     General: There is no distension.     Comments: Soft, nondistended, tender to palpation to lower abdomen and suprapubic area  Musculoskeletal:        General: Normal range of motion.      Cervical back: Neck supple.  Skin:    General: Skin is warm and dry.  Neurological:     Mental Status: She is alert and oriented to person, place, and time.  Psychiatric:        Mood and Affect: Mood and affect normal.      UC Treatments / Results  Labs (all labs ordered are listed, but only abnormal results are displayed) Labs Reviewed  POCT URINALYSIS DIP (MANUAL ENTRY) - Abnormal; Notable for the following components:      Result Value   Spec Grav, UA >=1.030 (*)    Blood, UA trace-intact (*)    All other components within normal limits  URINE CULTURE  POCT URINE PREGNANCY  CERVICOVAGINAL ANCILLARY ONLY    EKG   Radiology No results found.  Procedures Procedures (including critical care time)  Medications Ordered in UC Medications - No data to display  Initial Impression / Assessment and Plan / UC Course  I have reviewed the triage vital signs and the nursing notes.  Pertinent labs & imaging results that were available during my care of the patient were reviewed by me and considered in my medical decision making (see chart for details).     UA with negative leuks and nitrites, will send urine culture to definitively rule out UTI.  Vaginal swab pending to screen for any vaginal infections contributing to symptoms.  Treating symptomatically and supportively with Pyridium and Naprosyn in the interim.  Will call with results of provide further treatment as needed.  Discussed strict return precautions. Patient verbalized understanding and is agreeable with plan.  Final Clinical Impressions(s) / UC Diagnoses   Final diagnoses:  Lower abdominal pain  Dysuria     Discharge Instructions     Urine without obvious signs of infection, we will send for urine culture to confirm Vaginal swab pending to screen for any vaginal Infections contributing to urinary symptoms May use Pyridium as needed for burning sensation in the meantime Naprosyn as needed for abdominal  pain  Follow-up if any symptoms not improving or worsening    ED Prescriptions    Medication Sig Dispense Auth. Provider   phenazopyridine (PYRIDIUM) 200 MG tablet Take 1 tablet (200 mg total) by mouth 3 (three) times daily. 6 tablet Phillippe Orlick C, PA-C   naproxen (NAPROSYN) 500 MG tablet Take 1 tablet (500 mg total) by mouth 2 (  two) times daily. 30 tablet Tierre Gerard, [place] C, PA-C     PDMP not reviewed this encounter.   Lew Dawes, PA-C 12/21/19 1732

## 2019-12-21 NOTE — ED Triage Notes (Signed)
Patient complains of burning while urinating and LLQ and RLQ pain and tenderness. Pt is aox4 and ambulatory.

## 2019-12-21 NOTE — Discharge Instructions (Addendum)
Urine without obvious signs of infection, we will send for urine culture to confirm Vaginal swab pending to screen for any vaginal Infections contributing to urinary symptoms May use Pyridium as needed for burning sensation in the meantime Naprosyn as needed for abdominal pain  Follow-up if any symptoms not improving or worsening

## 2019-12-23 LAB — URINE CULTURE: Culture: NO GROWTH

## 2019-12-25 LAB — CERVICOVAGINAL ANCILLARY ONLY
Bacterial Vaginitis (gardnerella): POSITIVE — AB
Candida Glabrata: NEGATIVE
Candida Vaginitis: NEGATIVE
Chlamydia: NEGATIVE
Comment: NEGATIVE
Comment: NEGATIVE
Comment: NEGATIVE
Comment: NEGATIVE
Comment: NEGATIVE
Comment: NORMAL
Neisseria Gonorrhea: NEGATIVE
Trichomonas: NEGATIVE

## 2019-12-26 ENCOUNTER — Telehealth (HOSPITAL_COMMUNITY): Payer: Self-pay | Admitting: Emergency Medicine

## 2019-12-26 MED ORDER — METRONIDAZOLE 500 MG PO TABS
500.0000 mg | ORAL_TABLET | Freq: Two times a day (BID) | ORAL | 0 refills | Status: DC
Start: 2019-12-26 — End: 2021-08-19

## 2021-08-19 ENCOUNTER — Other Ambulatory Visit (HOSPITAL_COMMUNITY)
Admission: RE | Admit: 2021-08-19 | Discharge: 2021-08-19 | Disposition: A | Discharge status: Home / Self Care | Payer: 59 | Source: Ambulatory Visit | Attending: Family | Admitting: Family

## 2021-08-19 ENCOUNTER — Encounter: Payer: Self-pay | Admitting: Family

## 2021-08-19 ENCOUNTER — Ambulatory Visit (INDEPENDENT_AMBULATORY_CARE_PROVIDER_SITE_OTHER): Payer: 59 | Admitting: Family

## 2021-08-19 VITALS — BP 155/106 | HR 63 | Temp 98.2°F | Ht 69.0 in | Wt 189.5 lb

## 2021-08-19 DIAGNOSIS — N898 Other specified noninflammatory disorders of vagina: Secondary | ICD-10-CM

## 2021-08-19 DIAGNOSIS — Z780 Asymptomatic menopausal state: Secondary | ICD-10-CM | POA: Diagnosis not present

## 2021-08-19 DIAGNOSIS — I10 Essential (primary) hypertension: Secondary | ICD-10-CM

## 2021-08-19 MED ORDER — ESTRADIOL 0.025 MG/24HR TD PTWK: 0.0250 mg | MEDICATED_PATCH | TRANSDERMAL | 0 refills | Status: DC

## 2021-08-19 MED ORDER — PROGESTERONE MICRONIZED 100 MG PO CAPS: 100.0000 mg | ORAL_CAPSULE | Freq: Every day | ORAL | 2 refills | Status: DC

## 2021-08-19 MED ORDER — AMLODIPINE BESYLATE 5 MG PO TABS: 5.0000 mg | ORAL_TABLET | Freq: Every day | ORAL | 1 refills | Status: DC

## 2021-08-19 NOTE — Patient Instructions (Addendum)
Welcome to [facility], It was a pleasure meeting you today!   I will review your lab results via MyChart in a few days.  I have sent Amlodipine to start for your high blood pressure.   As discussed, I have sent a generic hormone patch (estrogen) to your pharmacy to help with your hot flashes and night sweats. You also need to take Progesterone and I recommend at bedtime to help with sleep maintenance.   If either med is too expensive, let me know.  Follow up in 1 month to discuss meds and can also do a physical with fasting labs.       PLEASE NOTE: If you had any LAB tests please let us know if you have not heard back within a few days. You may see your results on MyChart before we have a chance to review them but we will give you a call once they are reviewed by [place]. If we ordered any REFERRALS today, please let us know if you have not heard from their office within the next week.  Let us know through MyChart if you are needing REFILLS, or have your pharmacy send [place] the request. You can also use MyChart to communicate with me or any office staff.   Please try these tips to maintain a healthy lifestyle:  Eat most of your calories during the day when you are active. Eliminate processed foods including packaged sweets (pies, cakes, cookies), reduce intake of potatoes, white bread, white pasta, and white rice. Look for whole grain options, oat flour or almond flour.  Each meal should contain half fruits/vegetables, one quarter protein, and one quarter carbs (no bigger than a computer mouse).  Cut down on sweet beverages. This includes juice, soda, and sweet tea. Also watch fruit intake, though this is a healthier sweet option, it still contains natural sugar! Limit to 3 servings daily.  Drink at least 1 8oz. glass of water with each meal and aim for at least 8 glasses per day  Exercise at least 150 minutes every week.

## 2021-08-19 NOTE — Assessment & Plan Note (Signed)
   Chronic, never treated  pt has not had PCP for years  denies sx  starting Amlodipine 5mg  qd, advised on use & SE  advised on 2L water intake qd, low sodium diet, exercise  f/u 1 month

## 2021-08-19 NOTE — Progress Notes (Signed)
New Patient Office Visit  Subjective:  Patient ID: Cynthia Warren, female    DOB: November 09, 1975  Age: 46 y.o. MRN: [id number]  CC:  Chief Complaint  Patient presents with   Establish Care   Menopause    Pt states she is having bad night sweats for over a year. Pt states her cycles stopped 7 years ago.    Vaginal Discharge    Pt c/o pelvic and lower back pain, vaginal discharge for about 2 weeks. Pt has not tried anything for it.     HPI Cynthia Warren presents for establishing care today.  Vaginal discharge: pt reports having a thin, creamy/light yellow discharge x 2 weeks, denies itching or vaginal irritation, no urinary sx, denies foul odor, denies any sexual activity in over a year. Hypertension: Patient is currently maintained on the following medications for blood pressure: None - never treated Failed meds include: None Patient denies chest pain, headaches, shortness of breath or swelling. Last 3 blood pressure readings in our office are as follows: BP Readings from Last 3 Encounters:  08/19/21 (!) 155/106  12/21/19 117/84  10/16/19 (!) 143/109  Postmenopausal sx:  pt reports her cycle stopped 7 years ago. States she has not even had spotting, it just stopped, denies any surgery, cancer, or chemo/radiation tx. Reports hot flashes with night sweats as worst sx, also has some vaginal dryness, & mood swings. Has never been treated before, denies breast or ovarian cancer in direct relative.  Assessment & Plan:   Problem List Items Addressed This Visit       Cardiovascular and Mediastinum   Primary hypertension - Primary    Chronic, never treated pt has not had PCP for years denies sx starting Amlodipine 5mg  qd, advised on use & SE advised on 2L water intake qd, low sodium diet, exercise f/u 1 month      Relevant Medications   amLODipine (NORVASC) 5 MG tablet     Other   Postmenopausal estrogen deficiency    chronic hot flashes, night sweats menopause 7 years  ago - age 46 never on HRT denies breast/ovarian CA in mother, aunts, daughters or sisters discussed risks of HRT, pt smokes 4-5cigs/day & HTN (treating) starting estradiol patch, lowest dose and progesterone f/u 1 month       Relevant Medications   estradiol (CLIMARA - DOSED IN MG/24 HR) 0.025 mg/24hr patch   progesterone (PROMETRIUM) 100 MG capsule   Other Visit Diagnoses     Vaginal discharge    - reports having BV in past, checking urine today.    Relevant Orders   Urine cytology ancillary only      Subjective:    Outpatient Medications Prior to Visit  Medication Sig Dispense Refill   fluticasone (FLONASE) 50 MCG/ACT nasal spray Place 1-2 sprays into both nostrils daily. 16 g 2   ibuprofen (ADVIL) 800 MG tablet Take 1 tablet (800 mg total) by mouth 3 (three) times daily. 21 tablet 0   metroNIDAZOLE (FLAGYL) 500 MG tablet Take 1 tablet (500 mg total) by mouth 2 (two) times daily. 14 tablet 0   naproxen (NAPROSYN) 500 MG tablet Take 1 tablet (500 mg total) by mouth 2 (two) times daily. 30 tablet 0   phenazopyridine (PYRIDIUM) 200 MG tablet Take 1 tablet (200 mg total) by mouth 3 (three) times daily. 6 tablet 0   No facility-administered medications prior to visit.   Past Medical History:  Diagnosis Date   Anemia    Blood  transfusion without reported diagnosis    Past Surgical History:  Procedure Laterality Date   c section times 2     CESAREAN SECTION     2    Objective:   Today's Vitals: BP (!) 155/106 (BP Location: Left Arm, Patient Position: Sitting, Cuff Size: Large)   Pulse 63   Temp 98.2 F (36.8 C) (Temporal)   Ht 5\' 9"  (1.753 m)   Wt 189 lb 8 oz (86 kg)   SpO2 100%   BMI 27.98 kg/m   Physical Exam Vitals and nursing note reviewed.  Constitutional:      Appearance: Normal appearance.  Cardiovascular:     Rate and Rhythm: Normal rate and regular rhythm.  Pulmonary:     Effort: Pulmonary effort is normal.     Breath sounds: Normal breath sounds.   Musculoskeletal:        General: Normal range of motion.  Skin:    General: Skin is warm and dry.  Neurological:     Mental Status: She is alert.  Psychiatric:        Mood and Affect: Mood normal.        Behavior: Behavior normal.     Meds ordered this encounter  Medications   amLODipine (NORVASC) 5 MG tablet    Sig: Take 1 tablet (5 mg total) by mouth daily.    Dispense:  30 tablet    Refill:  1    Order Specific Question:   Supervising Provider    Answer:   ANDY, CAMILLE L [[id number]]   estradiol (CLIMARA - DOSED IN MG/24 HR) 0.025 mg/24hr patch    Sig: Place 1 patch (0.025 mg total) onto the skin once a week.    Dispense:  4 patch    Refill:  0    Order Specific Question:   Supervising Provider    Answer:   ANDY, CAMILLE L [[id number]]   progesterone (PROMETRIUM) 100 MG capsule    Sig: Take 1 capsule (100 mg total) by mouth at bedtime.    Dispense:  30 capsule    Refill:  2    Order Specific Question:   Supervising Provider    Answer:   ANDY, CAMILLE L [[id number]]    , NP

## 2021-08-19 NOTE — Assessment & Plan Note (Signed)
   chronic  hot flashes, night sweats  menopause 7 years ago - age 46  never on HRT  denies breast/ovarian CA in mother, aunts, daughters or sisters  discussed risks of HRT, pt smokes 4-5cigs/day & HTN (treating)  starting estradiol patch, lowest dose and progesterone  f/u 1 month

## 2021-08-20 LAB — URINE CYTOLOGY ANCILLARY ONLY
Bacterial Vaginitis-Urine: NEGATIVE
Candida Urine: NEGATIVE
Chlamydia: NEGATIVE
Comment: NEGATIVE
Comment: NEGATIVE
Comment: NORMAL
Neisseria Gonorrhea: NEGATIVE
Trichomonas: NEGATIVE

## 2021-08-20 NOTE — Progress Notes (Signed)
Your urine testing is negative for any STDs, yeast or bacterial vaginosis infection.   Let me know if your vaginal discharge continues or you develop worse symptoms with vaginal itching, irritation or foul odor.

## 2021-09-16 ENCOUNTER — Ambulatory Visit: Payer: 59 | Admitting: Family

## 2021-09-23 ENCOUNTER — Other Ambulatory Visit: Payer: Self-pay | Admitting: Family

## 2021-09-23 DIAGNOSIS — Z780 Asymptomatic menopausal state: Secondary | ICD-10-CM

## 2021-10-28 ENCOUNTER — Encounter: Payer: 59 | Admitting: Family

## 2021-10-30 ENCOUNTER — Other Ambulatory Visit: Payer: Self-pay | Admitting: Family

## 2021-10-30 DIAGNOSIS — Z780 Asymptomatic menopausal state: Secondary | ICD-10-CM

## 2021-12-23 ENCOUNTER — Ambulatory Visit
Admission: EM | Admit: 2021-12-23 | Discharge: 2021-12-23 | Disposition: A | Discharge status: Home / Self Care | Payer: 59 | Attending: Physician Assistant | Admitting: Physician Assistant

## 2021-12-23 ENCOUNTER — Encounter: Payer: Self-pay | Admitting: Physician Assistant

## 2021-12-23 DIAGNOSIS — R300 Dysuria: Secondary | ICD-10-CM | POA: Insufficient documentation

## 2021-12-23 LAB — POCT URINALYSIS DIP (MANUAL ENTRY)
Bilirubin, UA: NEGATIVE
Glucose, UA: NEGATIVE mg/dL
Leukocytes, UA: NEGATIVE
Nitrite, UA: NEGATIVE
Spec Grav, UA: >=1.03 — AB (ref 1.010–1.025)
Urobilinogen, UA: 0.2 E.U./dL
pH, UA: 6 (ref 5.0–8.0)

## 2021-12-23 LAB — POCT URINE PREGNANCY: Preg Test, Ur: NEGATIVE

## 2021-12-23 MED ORDER — NITROFURANTOIN MONOHYD MACRO 100 MG PO CAPS: 100.0000 mg | ORAL_CAPSULE | Freq: Two times a day (BID) | ORAL | 0 refills | Status: AC

## 2021-12-23 NOTE — ED Triage Notes (Signed)
Pt presents with co of dysuria, abd pain and urinary frequency for 4 days.

## 2021-12-23 NOTE — ED Provider Notes (Signed)
[name]    CSN: [id number] Arrival date & time: 12/23/21  1608      History   Chief Complaint Chief Complaint  Patient presents with   Abdominal Pain   Dysuria   Urinary Frequency    HPI Cynthia Warren is a 46 y.o. female.   Patient here today for evaluation of dysuria, abdominal pain and urinary frequency she has had for 4 days.  She reports that pain is located to her lower abdomen and she has had some back pain as well.  She denies any fever.  She does not report any treatment for symptoms.    The history is provided by the patient.  Abdominal Pain Associated symptoms: dysuria   Associated symptoms: no chills, no fever, no nausea, no shortness of breath and no vomiting   Dysuria Associated symptoms: abdominal pain   Associated symptoms: no fever, no nausea and no vomiting   Urinary Frequency Associated symptoms include abdominal pain. Pertinent negatives include no shortness of breath.    Past Medical History:  Diagnosis Date   Anemia    Blood transfusion without reported diagnosis     Patient Active Problem List   Diagnosis Date Noted   Primary hypertension 08/19/2021   Postmenopausal estrogen deficiency 08/19/2021    Past Surgical History:  Procedure Laterality Date   c section times 2     CESAREAN SECTION     2    OB History   No obstetric history on file.      Home Medications    Prior to Admission medications   Medication Sig Start Date End Date Taking? Authorizing Provider  nitrofurantoin, macrocrystal-monohydrate, (MACROBID) 100 MG capsule Take 1 capsule (100 mg total) by mouth 2 (two) times daily. 12/23/21  Yes Francene Finders, PA-C  amLODipine (NORVASC) 5 MG tablet Take 1 tablet (5 mg total) by mouth daily. 08/19/21   Jeanie Sewer, NP  estradiol (CLIMARA - DOSED IN MG/24 HR) 0.025 mg/24hr patch APPLY 1 PATCH TOPICALLY ONCE A WEEK 10/30/21   Jeanie Sewer, NP  progesterone (PROMETRIUM) 100 MG capsule Take  1 capsule (100 mg total) by mouth at bedtime. 08/19/21   Jeanie Sewer, NP    Family History Family History  Problem Relation Age of Onset   Breast cancer Maternal Grandmother 54   Hypertension Mother    Lung cancer Maternal Grandfather    Breast cancer Paternal Grandmother     Social History Social History   Tobacco Use   Smoking status: Some Days    Packs/day: 0.25    Years: 15.00    Total pack years: 3.75    Types: Cigarettes   Smokeless tobacco: Never  Vaping Use   Vaping Use: Never used  Substance Use Topics   Alcohol use: Yes    Alcohol/week: 1.0 standard drink of alcohol    Types: 1 Cans of beer per week    Comment: ocassionally   Drug use: Not Currently     Allergies   Patient has no known allergies.   Review of Systems Review of Systems  Constitutional:  Negative for chills and fever.  Respiratory:  Negative for shortness of breath.   Gastrointestinal:  Positive for abdominal pain. Negative for nausea and vomiting.  Genitourinary:  Positive for dysuria and frequency.  Musculoskeletal:  Positive for back pain.     Physical Exam Triage Vital Signs ED Triage Vitals  Enc Vitals Group     BP 12/23/21 1717 (!) 143/93  Pulse Rate 12/23/21 1717 69     Resp 12/23/21 1717 16     Temp 12/23/21 1717 98.2 F (36.8 C)     Temp Source 12/23/21 1717 Oral     SpO2 12/23/21 1717 98 %     Weight --      Height --      Head Circumference --      Peak Flow --      Pain Score 12/23/21 1716 7     Pain Loc --      Pain Edu? --      Excl. in GC? --    No data found.  Updated Vital Signs BP (!) 143/93   Pulse 69   Temp 98.2 F (36.8 C) (Oral)   Resp 16   LMP  (LMP Unknown) Comment: years since last lmp  SpO2 98%      Physical Exam Vitals and nursing note reviewed.  Constitutional:      General: She is not in acute distress.    Appearance: Normal appearance. She is not ill-appearing.  HENT:     Head: Normocephalic and atraumatic.     Nose:  Nose normal.     Mouth/Throat:     Mouth: Mucous membranes are moist.  Cardiovascular:     Rate and Rhythm: Normal rate.  Pulmonary:     Effort: Pulmonary effort is normal. No respiratory distress.  Skin:    General: Skin is warm and dry.  Neurological:     Mental Status: She is alert.  Psychiatric:        Mood and Affect: Mood normal.        Thought Content: Thought content normal.      UC Treatments / Results  Labs (all labs ordered are listed, but only abnormal results are displayed) Labs Reviewed  POCT URINALYSIS DIP (MANUAL ENTRY) - Abnormal; Notable for the following components:      Result Value   Ketones, POC UA trace (5) (*)    Spec Grav, UA >=1.030 (*)    Blood, UA small (*)    Protein Ur, POC trace (*)    All other components within normal limits  URINE CULTURE  POCT URINE PREGNANCY    EKG   Radiology No results found.  Procedures Procedures (including critical care time)  Medications Ordered in UC Medications - No data to display  Initial Impression / Assessment and Plan / UC Course  I have reviewed the triage vital signs and the nursing notes.  Pertinent labs & imaging results that were available during my care of the patient were reviewed by me and considered in my medical decision making (see chart for details).    Discussed that UA did not show signs of apparent UTI but will order culture and treat while awaiting results given reported symptoms. Patient expresses understanding and agrees with plan. Encouraged follow up with any further concerns.   Final Clinical Impressions(s) / UC Diagnoses   Final diagnoses:  Dysuria   Discharge Instructions   None    ED Prescriptions     Medication Sig Dispense Auth. Provider   nitrofurantoin, macrocrystal-monohydrate, (MACROBID) 100 MG capsule Take 1 capsule (100 mg total) by mouth 2 (two) times daily. 10 capsule Tomi Bamberger, PA-C      PDMP not reviewed this encounter.   Tomi Bamberger, PA-C 12/23/21 [id number]

## 2021-12-25 LAB — URINE CULTURE

## 2021-12-30 ENCOUNTER — Other Ambulatory Visit: Payer: Self-pay

## 2021-12-30 ENCOUNTER — Emergency Department: Payer: 59

## 2021-12-30 ENCOUNTER — Emergency Department
Admission: EM | Admit: 2021-12-30 | Discharge: 2021-12-30 | Disposition: A | Discharge status: Home / Self Care | Payer: 59 | Attending: Emergency Medicine | Admitting: Emergency Medicine

## 2021-12-30 DIAGNOSIS — R39198 Other difficulties with micturition: Secondary | ICD-10-CM | POA: Insufficient documentation

## 2021-12-30 DIAGNOSIS — I10 Essential (primary) hypertension: Secondary | ICD-10-CM | POA: Diagnosis not present

## 2021-12-30 DIAGNOSIS — R1031 Right lower quadrant pain: Secondary | ICD-10-CM | POA: Insufficient documentation

## 2021-12-30 DIAGNOSIS — R1032 Left lower quadrant pain: Secondary | ICD-10-CM | POA: Insufficient documentation

## 2021-12-30 DIAGNOSIS — M546 Pain in thoracic spine: Secondary | ICD-10-CM | POA: Diagnosis present

## 2021-12-30 DIAGNOSIS — R200 Anesthesia of skin: Secondary | ICD-10-CM | POA: Diagnosis not present

## 2021-12-30 DIAGNOSIS — R350 Frequency of micturition: Secondary | ICD-10-CM | POA: Diagnosis not present

## 2021-12-30 LAB — URINALYSIS, ROUTINE W REFLEX MICROSCOPIC
Bilirubin Urine: NEGATIVE
Glucose, UA: NEGATIVE mg/dL
Hgb urine dipstick: NEGATIVE
Ketones, ur: NEGATIVE mg/dL
Leukocytes,Ua: NEGATIVE
Nitrite: NEGATIVE
Protein, ur: NEGATIVE mg/dL
Specific Gravity, Urine: 1.016 (ref 1.005–1.030)
pH: 6 (ref 5.0–8.0)

## 2021-12-30 LAB — POC URINE PREG, ED: Preg Test, Ur: NEGATIVE

## 2021-12-30 MED ORDER — KETOROLAC TROMETHAMINE 15 MG/ML IJ SOLN
15.0000 mg | Freq: Once | INTRAMUSCULAR | Status: AC
  Administered 2021-12-30: 15 mg via INTRAMUSCULAR
  Filled 2021-12-30: qty 1

## 2021-12-30 NOTE — Discharge Instructions (Signed)
Your urine sample and CAT scan were reassuring.  I suspect that your pain is from muscle strain and spasm.  You can take Tylenol and Motrin for pain and use a heating pad.  Please follow-up with your primary care doctor.

## 2021-12-30 NOTE — ED Provider Notes (Signed)
[facility] Provider Note    Event Date/Time   First MD Initiated Contact with Patient 12/30/21 1002     (approximate)   History   Back Pain   HPI  Cynthia Warren is a 46 y.o. female past medical history of anemia who presents with back pain.  Symptoms are going on for about 2 weeks.  Pain is located in the right mid thoracic back.  It radiates around to the bilateral lower thoracic region.  Also radiates down to bilateral legs with associated numbness but no weakness.  Was having urgency frequency and some burning with urination when seen at urgent care several days ago with urinalysis that was rather benign and urine culture that is negative.  Denies fevers nausea vomiting.  Denies dyspnea.  Denies any exacerbating factors or injury preceding it.     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 --      Excl. in GC? --     Most recent vital signs: Vitals:   12/30/21 0909  BP: (!) 144/103  Pulse: 77  Resp: 18  Temp: 97.9 F (36.6 C)  SpO2: 100%     General: Awake, no distress.  CV:  Good peripheral perfusion.  Resp:  Normal effort.  Abd:  No distention.  Mild tenderness bilateral lower quadrants but no guarding Neuro:             Awake, Alert, Oriented x 3  Other:  Tenderness to palpation in the paraspinal thoracic region and right CVA  area no overlying skin changes 5/5 strength with plantarflexion and dorsiflexion, knee flexion extension bilaterally sensation grossly intact in bilateral lower extremities   ED Results / Procedures / Treatments  Labs (all labs ordered are listed, but only abnormal results are displayed) Labs Reviewed  URINALYSIS, ROUTINE W REFLEX MICROSCOPIC - Abnormal; Notable for the following components:      Result Value   Color, Urine YELLOW (*)    APPearance HAZY (*)    All other components within normal limits  POC URINE PREG, ED     EKG     RADIOLOGY Reviewed interpreted CT renal study which is negative for hydronephrosis or stone   PROCEDURES:  Critical Care performed: No  Procedures   MEDICATIONS ORDERED IN ED: Medications  ketorolac (TORADOL) 15 MG/ML injection 15 mg (15 mg Intramuscular Given 12/30/21 1033)     IMPRESSION / MDM / ASSESSMENT AND PLAN / ED COURSE  I reviewed the triage vital signs and the nursing notes.  Patient's presentation is most consistent with acute complicated illness / injury requiring diagnostic workup.  Differential diagnosis includes, but is not limited to, musculoskeletal pain/thoracic strain, kidney stone, biliary colic, PE less likely pyelonephritis  The patient is a 46 year old female presenting with back pain.  The pain is located in the right thoracic paraspinal region.  Is been about 2 weeks of symptoms occasionally pain radiates to the lower abdomen and she has associated numbness in bilateral lower extremities.  No bowel bladder incontinence but has had some urinary frequency and dysuria.  Initially was seen at urgent care and had a UA that was clean was prescribed antibiotics but then called and told she did not have UTI.    On my exam she is tender primarily over the right CVA region and paraspinal upper lumbar.  She has normal strength and sensation in lower extremities and is able to ambulate.  She does  have mild tenderness in bilateral lower quadrants but abdominal exam is overall benign.  Pregnancy test negative UA is clear.  Overall my suspicion is that this is musculoskeletal pain but given radiation to the abdomen did obtain CT renal study to evaluate for stone or other intra-abdominal process and this does not reveal any obvious pathology.  Plan to treat as musculoskeletal pain.  Discussed follow-up with primary care if symptoms or not improving.      FINAL CLINICAL IMPRESSION(S) / ED DIAGNOSES   Final diagnoses:  Acute right-sided thoracic back pain     Rx / DC Orders   ED Discharge Orders     None        Note:  This document was prepared using Dragon voice recognition software and may include unintentional dictation errors.   Rada Hay, MD 12/30/21 1248

## 2021-12-30 NOTE — ED Triage Notes (Signed)
Pt comes with c/o right sided low back pain. Pt states  this all started few days a go. Pt went to UC and was treated for UTI. Pt states she got call that it wasn't UTI and needed to come to ED to get checked out. Pt states it does shoot down her leg.

## 2022-01-01 ENCOUNTER — Other Ambulatory Visit: Payer: Self-pay | Admitting: Family

## 2022-01-01 DIAGNOSIS — Z780 Asymptomatic menopausal state: Secondary | ICD-10-CM

## 2022-01-31 ENCOUNTER — Other Ambulatory Visit: Payer: Self-pay | Admitting: Family

## 2022-01-31 DIAGNOSIS — Z780 Asymptomatic menopausal state: Secondary | ICD-10-CM

## 2022-02-26 ENCOUNTER — Other Ambulatory Visit: Payer: Self-pay | Admitting: Family

## 2022-02-26 DIAGNOSIS — I10 Essential (primary) hypertension: Secondary | ICD-10-CM

## 2022-02-26 DIAGNOSIS — Z780 Asymptomatic menopausal state: Secondary | ICD-10-CM

## 2022-03-09 ENCOUNTER — Other Ambulatory Visit: Payer: Self-pay | Admitting: Family

## 2022-03-09 DIAGNOSIS — Z780 Asymptomatic menopausal state: Secondary | ICD-10-CM

## 2022-03-17 ENCOUNTER — Other Ambulatory Visit: Payer: Self-pay | Admitting: Family

## 2022-03-17 DIAGNOSIS — Z1231 Encounter for screening mammogram for malignant neoplasm of breast: Secondary | ICD-10-CM

## 2022-04-09 ENCOUNTER — Other Ambulatory Visit: Payer: Self-pay | Admitting: Family

## 2022-04-09 DIAGNOSIS — Z780 Asymptomatic menopausal state: Secondary | ICD-10-CM

## 2022-04-14 ENCOUNTER — Other Ambulatory Visit: Payer: Self-pay | Admitting: Family

## 2022-04-14 DIAGNOSIS — Z780 Asymptomatic menopausal state: Secondary | ICD-10-CM

## 2022-04-30 ENCOUNTER — Ambulatory Visit: Payer: 59 | Admitting: Obstetrics and Gynecology

## 2022-05-04 ENCOUNTER — Ambulatory Visit
Admission: RE | Admit: 2022-05-04 | Discharge: 2022-05-04 | Disposition: A | Discharge status: Home / Self Care | Payer: PRIVATE HEALTH INSURANCE | Source: Ambulatory Visit | Attending: Family | Admitting: Family

## 2022-05-04 DIAGNOSIS — Z1231 Encounter for screening mammogram for malignant neoplasm of breast: Secondary | ICD-10-CM

## 2022-05-28 ENCOUNTER — Other Ambulatory Visit: Payer: Self-pay | Admitting: Family

## 2022-05-28 DIAGNOSIS — Z780 Asymptomatic menopausal state: Secondary | ICD-10-CM

## 2022-06-11 ENCOUNTER — Other Ambulatory Visit: Payer: Self-pay | Admitting: Family

## 2022-06-11 DIAGNOSIS — I10 Essential (primary) hypertension: Secondary | ICD-10-CM

## 2022-06-16 ENCOUNTER — Other Ambulatory Visit: Payer: Self-pay | Admitting: Family

## 2022-06-16 DIAGNOSIS — I10 Essential (primary) hypertension: Secondary | ICD-10-CM

## 2022-06-23 ENCOUNTER — Ambulatory Visit: Payer: 59 | Admitting: Obstetrics and Gynecology

## 2023-03-16 ENCOUNTER — Encounter (HOSPITAL_COMMUNITY): Payer: Self-pay

## 2023-03-16 ENCOUNTER — Emergency Department (HOSPITAL_COMMUNITY)

## 2023-03-16 ENCOUNTER — Emergency Department (HOSPITAL_COMMUNITY)
Admission: EM | Admit: 2023-03-16 | Discharge: 2023-03-17 | Disposition: A | Discharge status: Home / Self Care | Attending: Emergency Medicine | Admitting: Emergency Medicine

## 2023-03-16 ENCOUNTER — Other Ambulatory Visit: Payer: Self-pay

## 2023-03-16 DIAGNOSIS — R8289 Other abnormal findings on cytological and histological examination of urine: Secondary | ICD-10-CM | POA: Insufficient documentation

## 2023-03-16 DIAGNOSIS — R109 Unspecified abdominal pain: Secondary | ICD-10-CM | POA: Diagnosis present

## 2023-03-16 DIAGNOSIS — R1084 Generalized abdominal pain: Secondary | ICD-10-CM | POA: Insufficient documentation

## 2023-03-16 DIAGNOSIS — F17200 Nicotine dependence, unspecified, uncomplicated: Secondary | ICD-10-CM | POA: Insufficient documentation

## 2023-03-16 LAB — CBC WITH DIFFERENTIAL/PLATELET
Abs Immature Granulocytes: 0.03 10*3/uL (ref 0.00–0.07)
Basophils Absolute: 0.1 10*3/uL (ref 0.0–0.1)
Basophils Relative: 1 %
Eosinophils Absolute: 0.1 10*3/uL (ref 0.0–0.5)
Eosinophils Relative: 1 %
HCT: 39.8 % (ref 36.0–46.0)
Hemoglobin: 12.2 g/dL (ref 12.0–15.0)
Immature Granulocytes: 0 %
Lymphocytes Relative: 25 %
Lymphs Abs: 2.5 10*3/uL (ref 0.7–4.0)
MCH: 28 pg (ref 26.0–34.0)
MCHC: 30.7 g/dL (ref 30.0–36.0)
MCV: 91.5 fL (ref 80.0–100.0)
Monocytes Absolute: 0.7 10*3/uL (ref 0.1–1.0)
Monocytes Relative: 7 %
Neutro Abs: 6.6 10*3/uL (ref 1.7–7.7)
Neutrophils Relative %: 66 %
Platelets: 272 10*3/uL (ref 150–400)
RBC: 4.35 MIL/uL (ref 3.87–5.11)
RDW: 13.2 % (ref 11.5–15.5)
WBC: 9.9 10*3/uL (ref 4.0–10.5)
nRBC: 0 % (ref 0.0–0.2)

## 2023-03-16 LAB — COMPREHENSIVE METABOLIC PANEL
ALT: 40 U/L (ref 0–44)
AST: 29 U/L (ref 15–41)
Albumin: 4 g/dL (ref 3.5–5.0)
Alkaline Phosphatase: 66 U/L (ref 38–126)
Anion gap: 9 (ref 5–15)
BUN: 15 mg/dL (ref 6–20)
CO2: 24 mmol/L (ref 22–32)
Calcium: 9.5 mg/dL (ref 8.9–10.3)
Chloride: 105 mmol/L (ref 98–111)
Creatinine, Ser: 0.98 mg/dL (ref 0.44–1.00)
GFR, Estimated: >60 mL/min (ref >60)
Glucose, Bld: 92 mg/dL (ref 70–99)
Potassium: 4 mmol/L (ref 3.5–5.1)
Sodium: 138 mmol/L (ref 135–145)
Total Bilirubin: 0.5 mg/dL (ref 0.0–1.2)
Total Protein: 7 g/dL (ref 6.5–8.1)

## 2023-03-16 LAB — URINALYSIS, W/ REFLEX TO CULTURE (INFECTION SUSPECTED)
Bilirubin Urine: NEGATIVE
Glucose, UA: NEGATIVE mg/dL
Hgb urine dipstick: NEGATIVE
Ketones, ur: NEGATIVE mg/dL
Nitrite: NEGATIVE
Protein, ur: NEGATIVE mg/dL
Specific Gravity, Urine: 1.023 (ref 1.005–1.030)
pH: 5 (ref 5.0–8.0)

## 2023-03-16 LAB — LIPASE, BLOOD: Lipase: 36 U/L (ref 11–51)

## 2023-03-16 LAB — HCG, QUANTITATIVE, PREGNANCY: hCG, Beta Chain, Quant, S: 6 m[IU]/mL — ABNORMAL HIGH (ref <5)

## 2023-03-16 LAB — PREGNANCY, URINE: Preg Test, Ur: NEGATIVE

## 2023-03-16 MED ORDER — IOHEXOL 350 MG/ML SOLN
75.0000 mL | Freq: Once | INTRAVENOUS | Status: AC | PRN
  Administered 2023-03-16: 75 mL via INTRAVENOUS

## 2023-03-16 MED ORDER — SODIUM CHLORIDE 0.9 % IV SOLN
1.0000 g | Freq: Once | INTRAVENOUS | Status: AC
  Administered 2023-03-17: 1 g via INTRAVENOUS
  Filled 2023-03-16: qty 10

## 2023-03-16 MED ORDER — DICYCLOMINE HCL 10 MG PO CAPS
10.0000 mg | ORAL_CAPSULE | Freq: Once | ORAL | Status: AC
  Administered 2023-03-16: 10 mg via ORAL
  Filled 2023-03-16: qty 1

## 2023-03-16 NOTE — ED Provider Triage Note (Signed)
 Emergency Medicine Provider Triage Evaluation Note  Cynthia Warren , a 48 y.o. female  was evaluated in triage.  Pt complains of abdominal pain.  Review of Systems  Positive:  Negative:   Physical Exam  BP (!) 120/98 (BP Location: Right Arm)   Pulse 87   Temp 97.8 F (36.6 C) (Oral)   Resp 15   Ht 5\' 9"  (1.753 m)   Wt 84.4 kg   SpO2 100%   BMI 27.47 kg/m  Gen:   Awake, no distress   Resp:  Normal effort  MSK:   Moves extremities without difficulty  Other:    Medical Decision Making  Medically screening exam initiated at 4:09 PM.  Appropriate orders placed.  Ronald A Griego was informed that the remainder of the evaluation will be completed by another provider, this initial triage assessment does not replace that evaluation, and the importance of remaining in the ED until their evaluation is complete.  BL flank pain x2 weeks. Denies nausea, vomiting, diarrhea, hematuria, dysuria, hematochezia.    Dorthy Cooler, [place] 03/16/23 [phone]

## 2023-03-16 NOTE — ED Triage Notes (Signed)
 Pt came in via POV d/t abd & bil flank pain for the past two in a half weeks. Pt reports going to her PCP 5 days ago & was told she may have a kidney infection. Ws given meds for Tx with no changes. A/Ox4, rates her pain 7/10 during triage.

## 2023-03-16 NOTE — ED Provider Notes (Signed)
 Emergency Department Provider Note   I have reviewed the triage vital signs and the nursing notes.   HISTORY  Chief Complaint Abdominal Pain and Flank Pain   HPI Cynthia Warren is a 48 y.o. female presents to the ED with left flank and abdominal pain. Symptoms feel similar to prior UTI in the past but patient is not having dysuria, hesitancy, or urgency. No fever. She went to her PCP and was started on pyridium and Macrobid. She completed the course of abx without relief. No CP. No known injury.    Past Medical History:  Diagnosis Date   Anemia    Blood transfusion without reported diagnosis     Review of Systems  Constitutional: No fever/chills Cardiovascular: Denies chest pain. Respiratory: Denies shortness of breath. Gastrointestinal: Positive flank/abdominal pain.  No nausea, no vomiting.  No diarrhea.  No constipation. Genitourinary: Negative for dysuria. Musculoskeletal: Negative for back pain. Skin: Negative for rash. Neurological: Negative for headaches.   ____________________________________________   PHYSICAL EXAM:  VITAL SIGNS: ED Triage Vitals  Encounter Vitals Group     BP 03/16/23 1520 (!) 120/98     Pulse Rate 03/16/23 1520 87     Resp 03/16/23 1520 15     Temp 03/16/23 1520 97.8 F (36.6 C)     Temp Source 03/16/23 1520 Oral     SpO2 03/16/23 1520 100 %     Weight 03/16/23 1522 186 lb (84.4 kg)     Height 03/16/23 1522 5\' 9"  (1.753 m)   Constitutional: Alert and oriented. Well appearing and in no acute distress. Eyes: Conjunctivae are normal. Head: Atraumatic. Nose: No congestion/rhinnorhea. Mouth/Throat: Mucous membranes are moist. Neck: No stridor.  Cardiovascular: Normal rate, regular rhythm. Good peripheral circulation. Grossly normal heart sounds.   Respiratory: Normal respiratory effort.  No retractions. Lungs CTAB. Gastrointestinal: Soft with mild diffuse tenderness. No distention. Mild CVA tenderness bilaterally.   Musculoskeletal: No lower extremity tenderness nor edema. No gross deformities of extremities. Neurologic:  Normal speech and language.  Skin:  Skin is warm, dry and intact. No rash noted.  ____________________________________________   LABS (all labs ordered are listed, but only abnormal results are displayed)  Labs Reviewed  HCG, QUANTITATIVE, PREGNANCY - Abnormal; Notable for the following components:      Result Value   hCG, Beta Chain, Quant, S 6 (*)    All other components within normal limits  URINALYSIS, W/ REFLEX TO CULTURE (INFECTION SUSPECTED) - Abnormal; Notable for the following components:   APPearance CLOUDY (*)    Leukocytes,Ua MODERATE (*)    Bacteria, UA RARE (*)    All other components within normal limits  URINE CULTURE  COMPREHENSIVE METABOLIC PANEL  LIPASE, BLOOD  CBC WITH DIFFERENTIAL/PLATELET  PREGNANCY, URINE   ____________________________________________  RADIOLOGY  CT ABDOMEN PELVIS W CONTRAST Result Date: 03/16/2023 CLINICAL DATA:  Acute abdominal pain EXAM: CT ABDOMEN AND PELVIS WITH CONTRAST TECHNIQUE: Multidetector CT imaging of the abdomen and pelvis was performed using the standard protocol following bolus administration of intravenous contrast. RADIATION DOSE REDUCTION: This exam was performed according to the departmental dose-optimization program which includes automated exposure control, adjustment of the mA and/or kV according to patient size and/or use of iterative reconstruction technique. CONTRAST:  75mL OMNIPAQUE IOHEXOL 350 MG/ML SOLN COMPARISON:  CT abdomen and pelvis 12/30/2021. FINDINGS: Lower chest: No acute abnormality. Hepatobiliary: There are scattered rounded hypodensities throughout the liver which are too small to characterize favored as small cysts. Gallbladder and bile ducts are  within normal limits. Pancreas: Unremarkable. No pancreatic ductal dilatation or surrounding inflammatory changes. Spleen: Normal in size without focal  abnormality. Adrenals/Urinary Tract: Adrenal glands are unremarkable. Kidneys are normal, without renal calculi, focal lesion, or hydronephrosis. Bladder is unremarkable. Stomach/Bowel: Stomach is within normal limits. Appendix appears normal. No evidence of bowel wall thickening, distention, or inflammatory changes. Vascular/Lymphatic: Aortic atherosclerosis. No enlarged abdominal or pelvic lymph nodes. Reproductive: Pedunculated posterior fibroid measures 5.2 cm similar to prior. Adnexa are within normal limits. Other: No abdominal wall hernia or abnormality. No abdominopelvic ascites. Musculoskeletal: No acute or significant osseous findings. IMPRESSION: 1. No acute localizing process in the abdomen or pelvis. 2. Stable pedunculated posterior fibroid. 3. Aortic atherosclerosis. Aortic Atherosclerosis (ICD10-I70.0). Electronically Signed   By: Darliss Cheney M.D.   On: 03/16/2023 22:46    ____________________________________________   PROCEDURES  Procedure(s) performed:   Procedures  None  ____________________________________________   INITIAL IMPRESSION / ASSESSMENT AND PLAN / ED COURSE  Pertinent labs & imaging results that were available during my care of the patient were reviewed by me and considered in my medical decision making (see chart for details).   This patient is Presenting for Evaluation of abdominal pain, which does require a range of treatment options, and is a complaint that involves a high risk of morbidity and mortality.  The Differential Diagnoses includes but is not exclusive to acute cholecystitis, intrathoracic causes for epigastric abdominal pain, gastritis, duodenitis, pancreatitis, small bowel or large bowel obstruction, abdominal aortic aneurysm, hernia, gastritis, etc.   Critical Interventions-    Medications  iohexol (OMNIPAQUE) 350 MG/ML injection 75 mL (75 mLs Intravenous Contrast Given 03/16/23 2056)  cefTRIAXone (ROCEPHIN) 1 g in sodium chloride 0.9 % 100  mL IVPB (0 g Intravenous Stopped 03/17/23 0029)  dicyclomine (BENTYL) capsule 10 mg (10 mg Oral Given 03/16/23 2359)    Reassessment after intervention: pain improved.    Clinical Laboratory Tests Ordered, included UA with moderate leukocytes and bacteria. No nitrite. CMP with normal LFTs. No leukocytosis.   Radiologic Tests Ordered, included CT abdomen/pelvis. I independently interpreted the images and agree with radiology interpretation.   Cardiac Monitor Tracing which shows NSR.    Social Determinants of Health Risk patient is a smoker.   Medical Decision Making: Summary:  Patient presents two weeks of flank and abdominal pain. Questionable UTI on UA with flank pain. No radiographic evidence of pyelo or hydronephrosis. No other acute process. Patient with similar symptoms in the past with UTI so will cover with rocephin here and d/c home with abx. Plan also for bentyl. No evidence to strongly suspect ovarian torsion.    Patient's presentation is most consistent with acute presentation with potential threat to life or bodily function.   Disposition: discharge  ____________________________________________  FINAL CLINICAL IMPRESSION(S) / ED DIAGNOSES  Final diagnoses:  Generalized abdominal pain     NEW OUTPATIENT MEDICATIONS STARTED DURING THIS VISIT:  New Prescriptions   CEPHALEXIN (KEFLEX) 500 MG CAPSULE    Take 1 capsule (500 mg total) by mouth 2 (two) times daily for 7 days.   DICYCLOMINE (BENTYL) 20 MG TABLET    Take 1 tablet (20 mg total) by mouth 3 (three) times daily as needed.    Note:  This document was prepared using Dragon voice recognition software and may include unintentional dictation errors.  Alona Bene, MD, [facility] Emergency Medicine    Jovon Streetman, Arlyss Repress, MD 03/17/23 [phone]

## 2023-03-17 MED ORDER — CEPHALEXIN 500 MG PO CAPS: 500.0000 mg | ORAL_CAPSULE | Freq: Two times a day (BID) | ORAL | 0 refills | Status: AC

## 2023-03-17 MED ORDER — DICYCLOMINE HCL 20 MG PO TABS
20.0000 mg | ORAL_TABLET | Freq: Three times a day (TID) | ORAL | 0 refills | Status: AC | PRN
Start: 2023-03-17 — End: ?

## 2023-03-17 NOTE — Discharge Instructions (Signed)
